# Patient Record
Sex: Female | Born: 1970 | Race: Black or African American | Hispanic: No | Marital: Single | Smoking: Never smoker
Health system: Southern US, Community
[De-identification: ages and names within clinical notes are randomized; demographics above are authoritative.]

## PROBLEM LIST (undated history)

## (undated) DIAGNOSIS — F410 Panic disorder [episodic paroxysmal anxiety] without agoraphobia: Secondary | ICD-10-CM

## (undated) DIAGNOSIS — F419 Anxiety disorder, unspecified: Secondary | ICD-10-CM

## (undated) DIAGNOSIS — E1142 Type 2 diabetes mellitus with diabetic polyneuropathy: Secondary | ICD-10-CM

## (undated) DIAGNOSIS — J45909 Unspecified asthma, uncomplicated: Secondary | ICD-10-CM

## (undated) DIAGNOSIS — E119 Type 2 diabetes mellitus without complications: Secondary | ICD-10-CM

## (undated) HISTORY — PX: TUBAL LIGATION: SHX77

## (undated) HISTORY — PX: CATARACT EXTRACTION, BILATERAL: SHX1313

## (undated) HISTORY — DX: Unspecified asthma, uncomplicated: J45.909

## (undated) HISTORY — DX: Type 2 diabetes mellitus with diabetic polyneuropathy: E11.42

## (undated) HISTORY — PX: KNEE SURGERY: SHX244

---

## 2002-05-27 ENCOUNTER — Other Ambulatory Visit: Admission: RE | Admit: 2002-05-27 | Discharge: 2002-05-27 | Payer: Self-pay | Admitting: Obstetrics and Gynecology

## 2011-04-11 ENCOUNTER — Emergency Department (HOSPITAL_COMMUNITY): Payer: 59

## 2011-04-11 ENCOUNTER — Emergency Department (HOSPITAL_COMMUNITY)
Admission: EM | Admit: 2011-04-11 | Discharge: 2011-04-11 | Disposition: A | Discharge status: Home / Self Care | Payer: 59 | Attending: Emergency Medicine | Admitting: Emergency Medicine

## 2011-04-11 DIAGNOSIS — R1013 Epigastric pain: Secondary | ICD-10-CM | POA: Insufficient documentation

## 2011-04-11 DIAGNOSIS — I10 Essential (primary) hypertension: Secondary | ICD-10-CM | POA: Insufficient documentation

## 2011-04-11 DIAGNOSIS — K219 Gastro-esophageal reflux disease without esophagitis: Secondary | ICD-10-CM | POA: Insufficient documentation

## 2011-04-11 DIAGNOSIS — L298 Other pruritus: Secondary | ICD-10-CM | POA: Insufficient documentation

## 2011-04-11 DIAGNOSIS — R079 Chest pain, unspecified: Secondary | ICD-10-CM | POA: Insufficient documentation

## 2011-04-11 DIAGNOSIS — R110 Nausea: Secondary | ICD-10-CM | POA: Insufficient documentation

## 2011-04-11 DIAGNOSIS — R21 Rash and other nonspecific skin eruption: Secondary | ICD-10-CM | POA: Insufficient documentation

## 2011-04-11 DIAGNOSIS — T7840XA Allergy, unspecified, initial encounter: Secondary | ICD-10-CM | POA: Insufficient documentation

## 2011-04-11 DIAGNOSIS — L2989 Other pruritus: Secondary | ICD-10-CM | POA: Insufficient documentation

## 2011-04-11 LAB — DIFFERENTIAL
Basophils Absolute: 0 10*3/uL (ref 0.0–0.1)
Basophils Relative: 0 % (ref 0–1)
Eosinophils Absolute: 0.2 10*3/uL (ref 0.0–0.7)
Eosinophils Relative: 2 % (ref 0–5)
Monocytes Absolute: 1 10*3/uL (ref 0.1–1.0)

## 2011-04-11 LAB — CBC
HCT: 31.4 % — ABNORMAL LOW (ref 36.0–46.0)
MCHC: 33.8 g/dL (ref 30.0–36.0)
Platelets: 231 10*3/uL (ref 150–400)
RDW: 12.3 % (ref 11.5–15.5)

## 2011-04-11 LAB — COMPREHENSIVE METABOLIC PANEL
ALT: 9 U/L (ref 0–35)
AST: 13 U/L (ref 0–37)
Alkaline Phosphatase: 48 U/L (ref 39–117)
CO2: 30 mEq/L (ref 19–32)
Calcium: 8.8 mg/dL (ref 8.4–10.5)
Creatinine, Ser: 1.05 mg/dL (ref 0.50–1.10)
Total Bilirubin: 0.7 mg/dL (ref 0.3–1.2)

## 2011-04-11 LAB — LIPASE, BLOOD: Lipase: 27 U/L (ref 11–59)

## 2011-04-11 LAB — D-DIMER, QUANTITATIVE: D-Dimer, Quant: 0.82 ug/mL-FEU — ABNORMAL HIGH (ref 0.00–0.48)

## 2011-04-11 MED ORDER — IOHEXOL 300 MG/ML  SOLN
85.0000 mL | Freq: Once | INTRAMUSCULAR | Status: AC | PRN
  Administered 2011-04-11: 85 mL via INTRAVENOUS

## 2014-12-09 ENCOUNTER — Emergency Department (HOSPITAL_COMMUNITY): Payer: No Typology Code available for payment source

## 2014-12-09 ENCOUNTER — Encounter (HOSPITAL_COMMUNITY): Payer: Self-pay | Admitting: Emergency Medicine

## 2014-12-09 ENCOUNTER — Emergency Department (HOSPITAL_COMMUNITY)
Admission: EM | Admit: 2014-12-09 | Discharge: 2014-12-09 | Disposition: A | Discharge status: Home / Self Care | Payer: No Typology Code available for payment source | Attending: Emergency Medicine | Admitting: Emergency Medicine

## 2014-12-09 DIAGNOSIS — Z79899 Other long term (current) drug therapy: Secondary | ICD-10-CM | POA: Diagnosis not present

## 2014-12-09 DIAGNOSIS — Z880 Allergy status to penicillin: Secondary | ICD-10-CM | POA: Insufficient documentation

## 2014-12-09 DIAGNOSIS — Y998 Other external cause status: Secondary | ICD-10-CM | POA: Insufficient documentation

## 2014-12-09 DIAGNOSIS — Z8659 Personal history of other mental and behavioral disorders: Secondary | ICD-10-CM | POA: Diagnosis not present

## 2014-12-09 DIAGNOSIS — S4992XA Unspecified injury of left shoulder and upper arm, initial encounter: Secondary | ICD-10-CM | POA: Diagnosis present

## 2014-12-09 DIAGNOSIS — S79922A Unspecified injury of left thigh, initial encounter: Secondary | ICD-10-CM | POA: Insufficient documentation

## 2014-12-09 DIAGNOSIS — M79652 Pain in left thigh: Secondary | ICD-10-CM

## 2014-12-09 DIAGNOSIS — Y92410 Unspecified street and highway as the place of occurrence of the external cause: Secondary | ICD-10-CM | POA: Diagnosis not present

## 2014-12-09 DIAGNOSIS — S79912A Unspecified injury of left hip, initial encounter: Secondary | ICD-10-CM | POA: Insufficient documentation

## 2014-12-09 DIAGNOSIS — M25512 Pain in left shoulder: Secondary | ICD-10-CM

## 2014-12-09 DIAGNOSIS — E119 Type 2 diabetes mellitus without complications: Secondary | ICD-10-CM | POA: Insufficient documentation

## 2014-12-09 DIAGNOSIS — Y9389 Activity, other specified: Secondary | ICD-10-CM | POA: Diagnosis not present

## 2014-12-09 DIAGNOSIS — M25552 Pain in left hip: Secondary | ICD-10-CM

## 2014-12-09 HISTORY — DX: Type 2 diabetes mellitus without complications: E11.9

## 2014-12-09 HISTORY — DX: Panic disorder (episodic paroxysmal anxiety): F41.0

## 2014-12-09 HISTORY — DX: Anxiety disorder, unspecified: F41.9

## 2014-12-09 LAB — BASIC METABOLIC PANEL
Anion gap: 8 (ref 5–15)
BUN: 12 mg/dL (ref 6–20)
CALCIUM: 9.3 mg/dL (ref 8.9–10.3)
CO2: 29 mmol/L (ref 22–32)
Chloride: 100 mmol/L — ABNORMAL LOW (ref 101–111)
Creatinine, Ser: 1.07 mg/dL — ABNORMAL HIGH (ref 0.44–1.00)
GFR calc non Af Amer: >60 mL/min (ref >60)
Glucose, Bld: 113 mg/dL — ABNORMAL HIGH (ref 65–99)
Potassium: 4.4 mmol/L (ref 3.5–5.1)
SODIUM: 137 mmol/L (ref 135–145)

## 2014-12-09 LAB — CBC WITH DIFFERENTIAL/PLATELET
BASOS ABS: 0 10*3/uL (ref 0.0–0.1)
Basophils Relative: 0 % (ref 0–1)
Eosinophils Absolute: 0.1 10*3/uL (ref 0.0–0.7)
Eosinophils Relative: 1 % (ref 0–5)
HEMATOCRIT: 33.9 % — AB (ref 36.0–46.0)
Hemoglobin: 11 g/dL — ABNORMAL LOW (ref 12.0–15.0)
LYMPHS ABS: 1.7 10*3/uL (ref 0.7–4.0)
LYMPHS PCT: 25 % (ref 12–46)
MCH: 30.6 pg (ref 26.0–34.0)
MCHC: 32.4 g/dL (ref 30.0–36.0)
MCV: 94.4 fL (ref 78.0–100.0)
MONO ABS: 0.8 10*3/uL (ref 0.1–1.0)
MONOS PCT: 11 % (ref 3–12)
NEUTROS ABS: 4.3 10*3/uL (ref 1.7–7.7)
Neutrophils Relative %: 63 % (ref 43–77)
PLATELETS: 320 10*3/uL (ref 150–400)
RBC: 3.59 MIL/uL — AB (ref 3.87–5.11)
RDW: 12.5 % (ref 11.5–15.5)
WBC: 6.7 10*3/uL (ref 4.0–10.5)

## 2014-12-09 LAB — I-STAT BETA HCG BLOOD, ED (MC, WL, AP ONLY)

## 2014-12-09 MED ORDER — NAPROXEN 250 MG PO TABS: 250.0000 mg | ORAL_TABLET | Freq: Two times a day (BID) | ORAL | Status: DC

## 2014-12-09 MED ORDER — MORPHINE SULFATE 4 MG/ML IJ SOLN
4.0000 mg | Freq: Once | INTRAMUSCULAR | Status: AC
  Administered 2014-12-09: 4 mg via INTRAVENOUS
  Filled 2014-12-09: qty 1

## 2014-12-09 MED ORDER — METHOCARBAMOL 500 MG PO TABS: 500.0000 mg | ORAL_TABLET | Freq: Two times a day (BID) | ORAL | Status: DC | PRN

## 2014-12-09 MED ORDER — ONDANSETRON HCL 4 MG/2ML IJ SOLN
4.0000 mg | Freq: Once | INTRAMUSCULAR | Status: AC
  Administered 2014-12-09: 4 mg via INTRAVENOUS
  Filled 2014-12-09: qty 2

## 2014-12-09 NOTE — ED Notes (Signed)
Pt remains in radiology 
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
## 2014-12-09 NOTE — ED Notes (Signed)
Patient transported to X-ray 
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
## 2015-06-15 ENCOUNTER — Other Ambulatory Visit: Payer: Self-pay | Admitting: Obstetrics and Gynecology

## 2015-06-19 ENCOUNTER — Other Ambulatory Visit: Payer: Self-pay | Admitting: Family Medicine

## 2015-06-19 DIAGNOSIS — N289 Disorder of kidney and ureter, unspecified: Secondary | ICD-10-CM

## 2015-06-20 NOTE — H&P (Signed)
[name], [name] NO.:  [id number]  MEDICAL RECORD NO.:  [id number]  LOCATION:  [facility]                         FACILITY:  [place]  PHYSICIAN:  Lovenia Kim, M.D.DATE OF BIRTH:  1971/03/06  DATE OF ADMISSION:  06/08/2015 DATE OF DISCHARGE:                             HISTORY & PHYSICAL   PREOPERATIVE DIAGNOSIS:  Menometrorrhagia, for definitive therapy.  HISTORY OF PRESENT ILLNESS:  She is a 45 year old African American female G3, P1 who presents with menometrorrhagia for definitive therapy.  MEDICATIONS:  Valtrex.  ALLERGIES:  Penicillin and Flagyl.  OB/GYN HISTORY:  History of 1 therapeutic abortion and 1 vaginal delivery.  SURGICAL HISTORY:  Remarkable for knee surgery and tubal ligation.  FAMILY HISTORY:  Diabetes.  PHYSICAL EXAMINATION:  GENERAL:  A well-developed, well-nourished [place] American female, in no acute distress. HEENT:  Normal. NECK:  Supple.  Full range of motion. LUNGS:  Clear. HEART:  Regular rate and rhythm. ABDOMEN:  Soft and nontender.  Pelvic exam reveals a bulky irregularly shaped uterus which is anteverted.  No adnexal masses. EXTREMITIES:  There are no cords. NEUROLOGICAL:  Nonfocal. SKIN:  Intact.  IMPRESSION:  Menometrorrhagia for definitive therapy.  PLAN:  Proceed with diagnostic hysteroscopy, D and C, MyoSure resection of fibroid and NovaSure endometrial ablation.  Risks of anesthesia, infection, bleeding, injury to surrounding organs, possible need for repair were discussed.  Delayed versus immediate complications to include bowel and bladder injury are noted.  The patient acknowledges and wishes to proceed.     Lovenia Kim, M.D.     RJT/MEDQ  D:  06/20/2015  T:  06/20/2015  Job:  [id number]

## 2015-06-21 ENCOUNTER — Encounter (HOSPITAL_COMMUNITY)
Admission: RE | Disposition: A | Discharge status: Home / Self Care | Payer: Self-pay | Source: Ambulatory Visit | Attending: Obstetrics and Gynecology

## 2015-06-21 ENCOUNTER — Encounter (HOSPITAL_COMMUNITY): Payer: Self-pay | Admitting: Anesthesiology

## 2015-06-21 ENCOUNTER — Ambulatory Visit (HOSPITAL_COMMUNITY): Payer: 59 | Admitting: Anesthesiology

## 2015-06-21 ENCOUNTER — Ambulatory Visit (HOSPITAL_COMMUNITY)
Admission: RE | Admit: 2015-06-21 | Discharge: 2015-06-21 | Disposition: A | Discharge status: Home / Self Care | Payer: 59 | Source: Ambulatory Visit | Attending: Obstetrics and Gynecology | Admitting: Obstetrics and Gynecology

## 2015-06-21 DIAGNOSIS — D250 Submucous leiomyoma of uterus: Secondary | ICD-10-CM | POA: Insufficient documentation

## 2015-06-21 DIAGNOSIS — N921 Excessive and frequent menstruation with irregular cycle: Secondary | ICD-10-CM | POA: Insufficient documentation

## 2015-06-21 DIAGNOSIS — Z880 Allergy status to penicillin: Secondary | ICD-10-CM | POA: Diagnosis not present

## 2015-06-21 HISTORY — PX: DILATATION & CURETTAGE/HYSTEROSCOPY WITH MYOSURE: SHX6511

## 2015-06-21 HISTORY — PX: NOVASURE ABLATION: SHX5394

## 2015-06-21 LAB — BASIC METABOLIC PANEL WITH GFR
Anion gap: 7 (ref 5–15)
BUN: 19 mg/dL (ref 6–20)
CO2: 28 mmol/L (ref 22–32)
Calcium: 9.4 mg/dL (ref 8.9–10.3)
Chloride: 102 mmol/L (ref 101–111)
Creatinine, Ser: 1.13 mg/dL — ABNORMAL HIGH (ref 0.44–1.00)
GFR calc Af Amer: >60 mL/min
GFR calc non Af Amer: 58 mL/min — ABNORMAL LOW
Glucose, Bld: 143 mg/dL — ABNORMAL HIGH (ref 65–99)
Potassium: 4.4 mmol/L (ref 3.5–5.1)
Sodium: 137 mmol/L (ref 135–145)

## 2015-06-21 LAB — CBC
HEMATOCRIT: 36.3 % (ref 36.0–46.0)
HEMOGLOBIN: 11.8 g/dL — AB (ref 12.0–15.0)
MCH: 30.7 pg (ref 26.0–34.0)
MCHC: 32.5 g/dL (ref 30.0–36.0)
MCV: 94.5 fL (ref 78.0–100.0)
Platelets: 317 10*3/uL (ref 150–400)
RBC: 3.84 MIL/uL — ABNORMAL LOW (ref 3.87–5.11)
RDW: 13.9 % (ref 11.5–15.5)
WBC: 6.2 10*3/uL (ref 4.0–10.5)

## 2015-06-21 LAB — GLUCOSE, CAPILLARY: Glucose-Capillary: 74 mg/dL (ref 65–99)

## 2015-06-21 LAB — HCG, SERUM, QUALITATIVE: Preg, Serum: NEGATIVE

## 2015-06-21 SURGERY — NOVASURE ABLATION
Anesthesia: General | Site: Vagina

## 2015-06-21 MED ORDER — ONDANSETRON HCL 4 MG/2ML IJ SOLN
INTRAMUSCULAR | Status: AC
  Filled 2015-06-21: qty 2

## 2015-06-21 MED ORDER — SCOPOLAMINE 1 MG/3DAYS TD PT72
1.0000 | MEDICATED_PATCH | Freq: Once | TRANSDERMAL | Status: DC
  Administered 2015-06-21: 1.5 mg via TRANSDERMAL

## 2015-06-21 MED ORDER — ONDANSETRON HCL 4 MG/2ML IJ SOLN
INTRAMUSCULAR | Status: DC | PRN
  Administered 2015-06-21: 4 mg via INTRAVENOUS

## 2015-06-21 MED ORDER — PROPOFOL 10 MG/ML IV BOLUS
INTRAVENOUS | Status: AC
  Filled 2015-06-21: qty 20

## 2015-06-21 MED ORDER — CEFAZOLIN SODIUM-DEXTROSE 2-3 GM-% IV SOLR
INTRAVENOUS | Status: AC
  Filled 2015-06-21: qty 50

## 2015-06-21 MED ORDER — FENTANYL CITRATE (PF) 250 MCG/5ML IJ SOLN
INTRAMUSCULAR | Status: AC
  Filled 2015-06-21: qty 5

## 2015-06-21 MED ORDER — KETOROLAC TROMETHAMINE 30 MG/ML IJ SOLN
INTRAMUSCULAR | Status: DC | PRN
  Administered 2015-06-21: 30 mg via INTRAVENOUS

## 2015-06-21 MED ORDER — SCOPOLAMINE 1 MG/3DAYS TD PT72
MEDICATED_PATCH | TRANSDERMAL | Status: AC
  Filled 2015-06-21: qty 1

## 2015-06-21 MED ORDER — BUPIVACAINE HCL (PF) 0.25 % IJ SOLN
INTRAMUSCULAR | Status: AC
  Filled 2015-06-21: qty 30

## 2015-06-21 MED ORDER — LACTATED RINGERS IV SOLN
INTRAVENOUS | Status: DC
  Administered 2015-06-21 (×2): via INTRAVENOUS

## 2015-06-21 MED ORDER — LIDOCAINE HCL (CARDIAC) 20 MG/ML IV SOLN
INTRAVENOUS | Status: AC
  Filled 2015-06-21: qty 5

## 2015-06-21 MED ORDER — MIDAZOLAM HCL 2 MG/2ML IJ SOLN
INTRAMUSCULAR | Status: AC
  Filled 2015-06-21: qty 2

## 2015-06-21 MED ORDER — DEXAMETHASONE SODIUM PHOSPHATE 10 MG/ML IJ SOLN
INTRAMUSCULAR | Status: AC
  Filled 2015-06-21: qty 1

## 2015-06-21 MED ORDER — MIDAZOLAM HCL 2 MG/2ML IJ SOLN
INTRAMUSCULAR | Status: DC | PRN
  Administered 2015-06-21: 2 mg via INTRAVENOUS

## 2015-06-21 MED ORDER — VASOPRESSIN 20 UNIT/ML IV SOLN
INTRAVENOUS | Status: DC | PRN
  Administered 2015-06-21: 20 [IU]

## 2015-06-21 MED ORDER — GLYCOPYRROLATE 0.2 MG/ML IJ SOLN
INTRAMUSCULAR | Status: DC | PRN
  Administered 2015-06-21: 0.1 mg via INTRAVENOUS

## 2015-06-21 MED ORDER — LIDOCAINE HCL (CARDIAC) 20 MG/ML IV SOLN
INTRAVENOUS | Status: DC | PRN
  Administered 2015-06-21: 100 mg via INTRAVENOUS

## 2015-06-21 MED ORDER — CEFAZOLIN SODIUM-DEXTROSE 2-3 GM-% IV SOLR
2.0000 g | INTRAVENOUS | Status: DC
  Administered 2015-06-21: 2 g via INTRAVENOUS

## 2015-06-21 MED ORDER — ACETAMINOPHEN 160 MG/5ML PO SOLN
950.0000 mg | Freq: Once | ORAL | Status: AC
  Administered 2015-06-21: 950 mg via ORAL

## 2015-06-21 MED ORDER — SODIUM CHLORIDE 0.9 % IJ SOLN
INTRAMUSCULAR | Status: AC
  Filled 2015-06-21: qty 50

## 2015-06-21 MED ORDER — LACTATED RINGERS IR SOLN
Status: DC | PRN
  Administered 2015-06-21: 3000 mL

## 2015-06-21 MED ORDER — PROPOFOL 10 MG/ML IV BOLUS
INTRAVENOUS | Status: DC | PRN
  Administered 2015-06-21: 250 mg via INTRAVENOUS

## 2015-06-21 MED ORDER — BUPIVACAINE HCL (PF) 0.25 % IJ SOLN
INTRAMUSCULAR | Status: DC | PRN
  Administered 2015-06-21: 20 mL

## 2015-06-21 MED ORDER — ACETAMINOPHEN 160 MG/5ML PO SOLN
ORAL | Status: AC
  Filled 2015-06-21: qty 40.6

## 2015-06-21 MED ORDER — ACETAMINOPHEN 325 MG PO TABS: 975.0000 mg | ORAL_TABLET | Freq: Once | ORAL | Status: DC

## 2015-06-21 MED ORDER — KETOROLAC TROMETHAMINE 30 MG/ML IJ SOLN
INTRAMUSCULAR | Status: AC
  Filled 2015-06-21: qty 1

## 2015-06-21 MED ORDER — VASOPRESSIN 20 UNIT/ML IV SOLN
INTRAVENOUS | Status: AC
  Filled 2015-06-21: qty 1

## 2015-06-21 MED ORDER — HYDROCODONE-IBUPROFEN 7.5-200 MG PO TABS: 1.0000 | ORAL_TABLET | Freq: Three times a day (TID) | ORAL | Status: DC | PRN

## 2015-06-21 MED ORDER — GLYCOPYRROLATE 0.2 MG/ML IJ SOLN
INTRAMUSCULAR | Status: AC
  Filled 2015-06-21: qty 1

## 2015-06-21 MED ORDER — FENTANYL CITRATE (PF) 100 MCG/2ML IJ SOLN
INTRAMUSCULAR | Status: DC | PRN
  Administered 2015-06-21 (×2): 50 ug via INTRAVENOUS
  Administered 2015-06-21: 25 ug via INTRAVENOUS

## 2015-06-21 MED ORDER — FENTANYL CITRATE (PF) 100 MCG/2ML IJ SOLN: 25.0000 ug | INTRAMUSCULAR | Status: DC | PRN

## 2015-06-21 SURGICAL SUPPLY — 17 items
ABLATOR ENDOMETRIAL BIPOLAR (ABLATOR) ×3 IMPLANT
CATH ROBINSON RED A/P 16FR (CATHETERS) ×3 IMPLANT
CLOTH BEACON ORANGE TIMEOUT ST (SAFETY) ×3 IMPLANT
CONTAINER PREFILL 10% NBF 60ML (FORM) ×3 IMPLANT
DEVICE MYOSURE REACH (MISCELLANEOUS) ×3 IMPLANT
GLOVE BIO SURGEON STRL SZ7.5 (GLOVE) ×3 IMPLANT
GLOVE BIOGEL PI IND STRL 7.0 (GLOVE) ×2 IMPLANT
GLOVE BIOGEL PI INDICATOR 7.0 (GLOVE) ×1
GOWN STRL REUS W/TWL LRG LVL3 (GOWN DISPOSABLE) ×6 IMPLANT
PACK VAGINAL MINOR WOMEN LF (CUSTOM PROCEDURE TRAY) ×3 IMPLANT
PAD OB MATERNITY 4.3X12.25 (PERSONAL CARE ITEMS) ×3 IMPLANT
PAD PREP 24X48 CUFFED NSTRL (MISCELLANEOUS) ×3 IMPLANT
SYR TB 1ML 25GX5/8 (SYRINGE) ×3 IMPLANT
TOWEL OR 17X24 6PK STRL BLUE (TOWEL DISPOSABLE) ×6 IMPLANT
TUBING AQUILEX INFLOW (TUBING) ×3 IMPLANT
TUBING AQUILEX OUTFLOW (TUBING) ×3 IMPLANT
WATER STERILE IRR 1000ML POUR (IV SOLUTION) ×3 IMPLANT

## 2015-06-21 NOTE — Transfer of Care (Signed)
Immediate Anesthesia Transfer of Care Note  Patient: [name]  Procedure(s) Performed: Procedure(s): NOVASURE ABLATION (N/A) DILATATION & CURETTAGE/HYSTEROSCOPY WITH MYOSURE (N/A)  Patient Location: PACU  Anesthesia Type:General  Level of Consciousness: awake, alert , oriented and patient cooperative  Airway & Oxygen Therapy: Patient Spontanous Breathing and Patient connected to nasal cannula oxygen  Post-op Assessment: Report given to RN and Post -op Vital signs reviewed and stable  Post vital signs: Reviewed and stable  Last Vitals:  Filed Vitals:   06/21/15 1301  BP: 144/80  Pulse: 84  Temp: 36.8 C  Resp: 20    Complications: No apparent anesthesia complications

## 2015-06-21 NOTE — Progress Notes (Signed)
Patient ID: [name], female   DOB: 04/06/1971, 45 y.o.   MRN: [id number] Patient seen and examined. Consent witnessed and signed. No changes noted. Update completed.

## 2015-06-21 NOTE — Anesthesia Postprocedure Evaluation (Signed)
Anesthesia Post Note  Patient: [name]  Procedure(s) Performed: Procedure(s) (LRB): NOVASURE ABLATION (N/A) DILATATION & CURETTAGE/HYSTEROSCOPY WITH MYOSURE (N/A)  Patient location during evaluation: PACU Anesthesia Type: General Level of consciousness: sedated Pain management: satisfactory to patient Vital Signs Assessment: post-procedure vital signs reviewed and stable Respiratory status: spontaneous breathing Cardiovascular status: stable Anesthetic complications: no    Last Vitals:  Filed Vitals:   06/21/15 1600 06/21/15 1615  BP:  137/88  Pulse: 58 61  Temp:    Resp:      Last Pain: There were no vitals filed for this visit.               Riccardo Dubin

## 2015-06-21 NOTE — Op Note (Signed)
06/21/2015  2:36 PM  PATIENT:  Clarisa Fling  45 y.o. female  PRE-OPERATIVE DIAGNOSIS:  Menorrhagia  POST-OPERATIVE DIAGNOSIS:  Menorrhagia  PROCEDURE:  Procedure(s): NOVASURE ABLATION DILATATION & CURETTAGE/HYSTEROSCOPY WITH MYOSURE  SURGEON:  Surgeon(s): Brien Few, MD  ASSISTANTS: none   ANESTHESIA:   local and general  ESTIMATED BLOOD LOSS: * No blood loss amount entered *   DRAINS: none   LOCAL MEDICATIONS USED:  MARCAINE    and Amount: 20 ml  SPECIMEN:  Source of Specimen:  fibroid, EMC  DISPOSITION OF SPECIMEN:  PATHOLOGY  COUNTS:  YES  DICTATION #[id number]  PLAN OF CARE: dc home  PATIENT DISPOSITION:  PACU - hemodynamically stable.

## 2015-06-21 NOTE — Anesthesia Preprocedure Evaluation (Signed)
Anesthesia Evaluation  Patient identified by MRN, date of birth, ID band Patient awake    Reviewed: Allergy & Precautions, H&P , Patient's Chart, lab work & pertinent test results, reviewed documented beta blocker date and time   Airway Mallampati: II  TM Distance: >3 FB Neck ROM: full    Dental no notable dental hx.    Pulmonary    Pulmonary exam normal breath sounds clear to auscultation       Cardiovascular  Rhythm:regular Rate:Normal     Neuro/Psych    GI/Hepatic   Endo/Other  diabetes  Renal/GU      Musculoskeletal   Abdominal   Peds  Hematology   Anesthesia Other Findings   Reproductive/Obstetrics                             Anesthesia Physical Anesthesia Plan  ASA: II  Anesthesia Plan:    Post-op Pain Management:    Induction: Intravenous  Airway Management Planned: LMA  Additional Equipment:   Intra-op Plan:   Post-operative Plan:   Informed Consent: I have reviewed the patients History and Physical, chart, labs and discussed the procedure including the risks, benefits and alternatives for the proposed anesthesia with the patient or authorized representative who has indicated his/her understanding and acceptance.   Dental Advisory Given and Dental advisory given  Plan Discussed with: CRNA and Surgeon  Anesthesia Plan Comments: (Discussed GA with LMA, possible sore throat, potential need to switch to ETT, N/V, pulmonary aspiration. Questions answered. )        Anesthesia Quick Evaluation

## 2015-06-21 NOTE — Op Note (Signed)
NAMEANARAH, DENBY NO.:  [id number]  MEDICAL RECORD NO.:  [id number]  LOCATION:  [facility]                          FACILITY:  [facility]  PHYSICIAN:  Lovenia Kim, M.D.DATE OF BIRTH:  1971/02/04  DATE OF PROCEDURE: DATE OF DISCHARGE:  06/21/2015                              OPERATIVE REPORT   PREOPERATIVE DIAGNOSIS:  Menometrorrhagia with submucous fibroid.  POSTOPERATIVE DIAGNOSIS:  Menometrorrhagia with submucous fibroid.  PROCEDURE:  Diagnostic hysteroscopy, D and C, MyoSure resection of submucous fibroid, NovaSure endometrial ablation.  SURGEON:  Lovenia Kim, MD  ASSISTANT:  None.  ANESTHESIA:  General and local.  ESTIMATED BLOOD LOSS:  Less than 50 mL.  FLUID DEFICIT:  Less than 50 mL.  COMPLICATIONS:  None.  DRAINS:  None.  COUNTS:  Correct.  DISPOSITION:  The patient to recovery in good condition.  BRIEF OPERATIVE NOTE:  After being apprised of risks of anesthesia, infection, bleeding, injury to surrounding organs, possible need for repair, delayed versus immediate complications to include bowel and bladder injury, possible need for repair, the patient was brought to the operating room where she was administered general anesthetic without complications.  Prepped and draped in usual sterile fashion. Catheterized until the bladder was empty.  Exam under anesthesia reveals a bulky anteflexed uterus.  No adnexal masses.  Dilute Pitressin solution placed at 3 and 9 o'clock 20 mL total dilute Marcaine solution standard paracervical block 20 mL total.  Cervix dilated up to a 17 Pratt dilator.  Hysteroscope placed.  Visualization reveals a submucous fibroid in the lower left portion of the uterine mid body and also in the anterior fundal.  The MyoSure was entered and resection and multiple passes of the posterior wall anterior fall fibroid were done without difficulty.  Good hemostasis was noted.  The instrument was removed. NovaSure device  was placed and seated to a width of 3.2, a length of 6, and initiated with a negative CO2 test.  At the termination of the procedure, NovaSure device was removed and inspected.  The cavity was found to be intact without evidence of perforation.  Good evidence of ablation noted.  The patient tolerated this procedure well, was awakened, and transferred to recovery in good condition.     Lovenia Kim, M.D.     RJT/MEDQ  D:  06/21/2015  T:  06/21/2015  Job:  [id number]

## 2015-06-21 NOTE — Discharge Instructions (Signed)

## 2015-06-21 NOTE — Anesthesia Procedure Notes (Signed)
Procedure Name: LMA Insertion Date/Time: 06/21/2015 2:00 PM Performed by: Georgeanne Nim Pre-anesthesia Checklist: Patient identified, Patient being monitored, Timeout performed, Emergency Drugs available and Suction available Patient Re-evaluated:Patient Re-evaluated prior to inductionOxygen Delivery Method: Circle system utilized Preoxygenation: Pre-oxygenation with 100% oxygen Intubation Type: IV induction Ventilation: Mask ventilation without difficulty LMA: LMA with gastric port inserted LMA Size: 4.0 Number of attempts: 1 Placement Confirmation: positive ETCO2,  CO2 detector and breath sounds checked- equal and bilateral Tube secured with: Tape Dental Injury: Teeth and Oropharynx as per pre-operative assessment

## 2015-06-22 ENCOUNTER — Other Ambulatory Visit: Payer: Self-pay | Admitting: Family Medicine

## 2015-06-22 ENCOUNTER — Encounter (HOSPITAL_COMMUNITY): Payer: Self-pay | Admitting: Obstetrics and Gynecology

## 2015-06-22 ENCOUNTER — Other Ambulatory Visit: Payer: Self-pay

## 2015-06-22 ENCOUNTER — Ambulatory Visit
Admission: RE | Admit: 2015-06-22 | Discharge: 2015-06-22 | Disposition: A | Discharge status: Home / Self Care | Payer: 59 | Source: Ambulatory Visit | Attending: Family Medicine | Admitting: Family Medicine

## 2015-06-22 DIAGNOSIS — N289 Disorder of kidney and ureter, unspecified: Secondary | ICD-10-CM

## 2015-08-20 ENCOUNTER — Encounter: Payer: Self-pay | Admitting: Family Medicine

## 2015-08-20 ENCOUNTER — Ambulatory Visit (INDEPENDENT_AMBULATORY_CARE_PROVIDER_SITE_OTHER): Payer: 59 | Admitting: Family Medicine

## 2015-08-20 VITALS — Ht 74.0 in | Wt 245.5 lb

## 2015-08-20 DIAGNOSIS — R739 Hyperglycemia, unspecified: Secondary | ICD-10-CM | POA: Diagnosis not present

## 2015-08-20 DIAGNOSIS — E669 Obesity, unspecified: Secondary | ICD-10-CM | POA: Diagnosis not present

## 2015-08-20 NOTE — Patient Instructions (Signed)
   Controlling your blood sugar and weight is going to mean making some significant changes in both your diet and your activity level.   The good news about making dietary changes:  TASTE PREFERENCES ARE LEARNED.  This means that it will get easier to choose foods you know are good for you if you are exposed to them enough.    Diet Recommendations for Diabetes  Starchy (carb) foods: Bread, rice, pasta, potatoes, corn, cereal, grits, crackers, bagels, muffins, all baked goods.  (Fruits, milk, and yogurt also have carbohydrate, but most of these foods will not spike your blood sugar as the starchy foods will.)  A few fruits do cause high blood sugars; use small portions of bananas (limit to 1/2 at a time), grapes, watermelon, oranges, and most tropical fruits.    Protein foods: Meat, fish, poultry, eggs, dairy foods, and beans such as pinto and kidney beans (beans also provide carbohydrate).   1. Eat at least 3 meals and 1-2 snacks per day. Never go more than 4-5 hours while awake without eating. Eat breakfast within the first hour of getting up.   2. Limit starchy foods to TWO per meal and ONE per snack. ONE portion of a starchy  food is equal to the following:   - ONE slice of bread (or its equivalent, such as half of a hamburger bun).   - 1/2 cup of a "scoopable" starchy food such as potatoes or rice.   - 15 grams of total carbohydrate as shown on food label.   - Beverages count too!  For example, for every 15 grams of carb from soda/juice, this is ONE carb portion.   3. Include at every meal: a protein food, a carb food, and vegetables and/or fruit.   - Obtain twice the volume of veg's as protein or carbohydrate foods for both lunch and dinner.   - Fresh or frozen veg's are best.   - Keep frozen veg's on hand for a quick vegetable serving.     - Consider using canned soup occasionally; ALWAYS first microwave veg's, then add soup, and re-heat.    4. Physical activity:  30 min 5 X wk.  For best  blood sugar control, you may want to do this after your biggest meal.    - Also important is to move intermittently during the day, i.e., "microbreaks" of moving 5 min for every 60 you sit or 2 min for every 20 you sit.

## 2015-08-20 NOTE — Progress Notes (Signed)
Medical Nutrition Therapy:  Appt start time: 0900 end time:  1000. [name] was referred by Jennifer Few, MD [facility]) for elevated A1C and obesity.  She would like to discontinue metformin, so is interested in making dietary/lifestyle changes.  She has started an herbal tea "cleanse," for one week, with a plan to do a "diabetic tea" following this.  Her grandmother has followed this regimen for more than a year, and has controlled her DM well.  [name] acknowledges that healthy food choices and exercise will be a necessary part of blood sugar mgmt.    [name] travels as a Ecologist, usually on the road Mon-Fri, so is usually [name].  [name] said she will sometimes bring breakfast to work with her, then end up throwing it away when she gets busy, and has forgotten to eat it.    Assessment:  Primary concerns today: Weight management and Blood sugar control.   Learning Readiness:  Ready  Usual eating pattern includes 1-3 meals and 1-2 snacks per day. Frequent foods and beverages include chx, Sprite, ff's, fruit ~5 X wk.  Eats veg's 2-3 X wk (likes grn beans, broccoli, carrots).  Avoided foods include most veg's.   Usual physical activity is variable since knee surgery in June 2016 (chipped patella from fall).  [place] again in Dec 2016, and injured back, but currently walks ~60 min 1-2 X a week.  She started making better food choices ~August 2016, but said her weight has continued to rise despite her efforts.  [name] said her normal weight is ~202.  A trial of phentermine was helpful in appetite control, but discontinued after >1 yr b/c of side effects.    24-hr recall: (Up at 7:30 AM) B ( AM)-   water Snk (1 PM)-   1 small bag chips, 8 oz ginger ale L ( PM)-  Slept much of day; didn't feel well, & had  Snk ( PM)-  been traveling till late Sat PM D (10 PM)-  1 1/2 cans chx noodle soup (headache), 8 oz ginger ale Snk ( PM)-  --- Typical day? Yes.   There is no "normal";  schedule is erratic.    Progress Towards Goal(s):  In progress.   Nutritional Diagnosis:  NB-1.7 Undesireable food choices As related to low priority on food choices.  As evidenced by erratic eating pattern and frequent takeout/restaurant foods.    Intervention:  Nutrition education.  Handouts given during visit include:  AVS  Plate method handout  Demonstrated degree of understanding via:  Teach Back  Barriers to learning/adherence to lifestyle change: Weekly travel for work.    Monitoring/Evaluation:  Dietary intake, exercise, and body weight in 6 week(s).

## 2015-10-01 ENCOUNTER — Ambulatory Visit: Payer: 59 | Admitting: Family Medicine

## 2019-03-09 ENCOUNTER — Emergency Department (HOSPITAL_COMMUNITY)
Admission: EM | Admit: 2019-03-09 | Discharge: 2019-03-10 | Disposition: A | Discharge status: Home / Self Care | Payer: BC Managed Care – PPO | Attending: Emergency Medicine | Admitting: Emergency Medicine

## 2019-03-09 ENCOUNTER — Emergency Department (HOSPITAL_COMMUNITY): Payer: BC Managed Care – PPO

## 2019-03-09 ENCOUNTER — Other Ambulatory Visit: Payer: Self-pay

## 2019-03-09 ENCOUNTER — Encounter (HOSPITAL_COMMUNITY): Payer: Self-pay

## 2019-03-09 DIAGNOSIS — R51 Headache: Secondary | ICD-10-CM | POA: Insufficient documentation

## 2019-03-09 DIAGNOSIS — R479 Unspecified speech disturbances: Secondary | ICD-10-CM | POA: Diagnosis not present

## 2019-03-09 DIAGNOSIS — Z7984 Long term (current) use of oral hypoglycemic drugs: Secondary | ICD-10-CM | POA: Insufficient documentation

## 2019-03-09 DIAGNOSIS — E1165 Type 2 diabetes mellitus with hyperglycemia: Secondary | ICD-10-CM | POA: Insufficient documentation

## 2019-03-09 DIAGNOSIS — R200 Anesthesia of skin: Secondary | ICD-10-CM | POA: Insufficient documentation

## 2019-03-09 DIAGNOSIS — R739 Hyperglycemia, unspecified: Secondary | ICD-10-CM

## 2019-03-09 DIAGNOSIS — R202 Paresthesia of skin: Secondary | ICD-10-CM | POA: Diagnosis not present

## 2019-03-09 LAB — CBC WITH DIFFERENTIAL/PLATELET
Abs Immature Granulocytes: 0.01 10*3/uL (ref 0.00–0.07)
Basophils Absolute: 0 10*3/uL (ref 0.0–0.1)
Basophils Relative: 1 %
Eosinophils Absolute: 0 10*3/uL (ref 0.0–0.5)
Eosinophils Relative: 0 %
HCT: 37.6 % (ref 36.0–46.0)
Hemoglobin: 12.3 g/dL (ref 12.0–15.0)
Immature Granulocytes: 0 %
Lymphocytes Relative: 27 %
Lymphs Abs: 1.6 10*3/uL (ref 0.7–4.0)
MCH: 32.2 pg (ref 26.0–34.0)
MCHC: 32.7 g/dL (ref 30.0–36.0)
MCV: 98.4 fL (ref 80.0–100.0)
Monocytes Absolute: 0.7 10*3/uL (ref 0.1–1.0)
Monocytes Relative: 12 %
Neutro Abs: 3.5 10*3/uL (ref 1.7–7.7)
Neutrophils Relative %: 60 %
Platelets: 245 10*3/uL (ref 150–400)
RBC: 3.82 MIL/uL — ABNORMAL LOW (ref 3.87–5.11)
RDW: 11.9 % (ref 11.5–15.5)
WBC: 5.8 10*3/uL (ref 4.0–10.5)
nRBC: 0 % (ref 0.0–0.2)

## 2019-03-09 LAB — COMPREHENSIVE METABOLIC PANEL
ALT: 12 U/L (ref 0–44)
AST: 18 U/L (ref 15–41)
Albumin: 2.8 g/dL — ABNORMAL LOW (ref 3.5–5.0)
Alkaline Phosphatase: 57 U/L (ref 38–126)
Anion gap: 9 (ref 5–15)
BUN: 9 mg/dL (ref 6–20)
CO2: 17 mmol/L — ABNORMAL LOW (ref 22–32)
Calcium: 6.9 mg/dL — ABNORMAL LOW (ref 8.9–10.3)
Chloride: 111 mmol/L (ref 98–111)
Creatinine, Ser: 0.72 mg/dL (ref 0.44–1.00)
GFR calc Af Amer: >60 mL/min (ref >60)
GFR calc non Af Amer: >60 mL/min (ref >60)
Glucose, Bld: 399 mg/dL — ABNORMAL HIGH (ref 70–99)
Potassium: 3.3 mmol/L — ABNORMAL LOW (ref 3.5–5.1)
Sodium: 137 mmol/L (ref 135–145)
Total Bilirubin: 0.9 mg/dL (ref 0.3–1.2)
Total Protein: 5.3 g/dL — ABNORMAL LOW (ref 6.5–8.1)

## 2019-03-09 LAB — BLOOD GAS, VENOUS
Acid-base deficit: 2.4 mmol/L — ABNORMAL HIGH (ref 0.0–2.0)
Bicarbonate: 20.3 mmol/L (ref 20.0–28.0)
O2 Saturation: 99.7 %
Patient temperature: 98.6
pCO2, Ven: 30.2 mmHg — ABNORMAL LOW (ref 44.0–60.0)
pH, Ven: 7.442 — ABNORMAL HIGH (ref 7.250–7.430)
pO2, Ven: 204 mmHg — ABNORMAL HIGH (ref 32.0–45.0)

## 2019-03-09 LAB — URINALYSIS, ROUTINE W REFLEX MICROSCOPIC
Bacteria, UA: NONE SEEN
Bilirubin Urine: NEGATIVE
Glucose, UA: >=500 mg/dL — AB
Hgb urine dipstick: NEGATIVE
Ketones, ur: 20 mg/dL — AB
Leukocytes,Ua: NEGATIVE
Nitrite: NEGATIVE
Protein, ur: NEGATIVE mg/dL
Specific Gravity, Urine: 1.035 — ABNORMAL HIGH (ref 1.005–1.030)
pH: 6 (ref 5.0–8.0)

## 2019-03-09 LAB — RAPID URINE DRUG SCREEN, HOSP PERFORMED
Amphetamines: NOT DETECTED
Barbiturates: NOT DETECTED
Benzodiazepines: NOT DETECTED
Cocaine: NOT DETECTED
Opiates: NOT DETECTED
Tetrahydrocannabinol: NOT DETECTED

## 2019-03-09 LAB — CBG MONITORING, ED
Glucose-Capillary: 341 mg/dL — ABNORMAL HIGH (ref 70–99)
Glucose-Capillary: 354 mg/dL — ABNORMAL HIGH (ref 70–99)
Glucose-Capillary: 463 mg/dL — ABNORMAL HIGH (ref 70–99)

## 2019-03-09 LAB — HCG, QUANTITATIVE, PREGNANCY: hCG, Beta Chain, Quant, S: <1 m[IU]/mL (ref <5)

## 2019-03-09 MED ORDER — METFORMIN HCL 500 MG PO TABS: 500.0000 mg | ORAL_TABLET | Freq: Two times a day (BID) | ORAL | 0 refills | Status: DC

## 2019-03-09 MED ORDER — GADOBUTROL 1 MMOL/ML IV SOLN
10.0000 mL | Freq: Once | INTRAVENOUS | Status: AC | PRN
  Administered 2019-03-09: 10 mL via INTRAVENOUS

## 2019-03-09 MED ORDER — INSULIN ASPART 100 UNIT/ML ~~LOC~~ SOLN
5.0000 [IU] | Freq: Once | SUBCUTANEOUS | Status: AC
  Administered 2019-03-09: 5 [IU] via SUBCUTANEOUS
  Filled 2019-03-09: qty 0.05

## 2019-03-09 MED ORDER — SODIUM CHLORIDE 0.9 % IV BOLUS
1000.0000 mL | Freq: Once | INTRAVENOUS | Status: AC
  Administered 2019-03-09: 1000 mL via INTRAVENOUS

## 2019-03-09 NOTE — Discharge Instructions (Signed)
Please call your primary doctor tomorrow to schedule close follow-up appointment regarding symptoms you are having today and the elevated blood sugars.  Recommend starting the newly prescribed metformin.  If you develop numbness, weakness, speech changes, vision changes, chest pain or difficulty breathing or fevers, please return to ER for reassessment.

## 2019-03-09 NOTE — ED Triage Notes (Signed)
BIB EMS. EMS reports call was intially headache, and  tingling in hands. CBG with EMS 512 no history of diabetes however hx of pre diabetes 5 years ago. PT reports feeling better after receiving IV fluids 400 NS.  Pt alert and oriented at this time.   20 RAC 400 of NS in route  118/78 80

## 2019-03-09 NOTE — ED Notes (Addendum)
Pt. Documented in error MR BRAIN WO CONTRAST.

## 2019-03-09 NOTE — ED Provider Notes (Addendum)
[facility] DEPT Provider Note   CSN: [id number] Arrival date & time: 03/09/19  1944     History   Chief Complaint Chief Complaint  Patient presents with   Hyperglycemia    HPI [name] is a 48 y.o. female.  Presents the ER with transient episode of difficulty speaking, headache, tingling and elevated sugar.  Patient states around 630 this evening she had sudden onset head pressure, left hand tingling, difficulty speaking.  States she had difficulty getting the right words out and was somewhat confused.  Denies any loss of consciousness, no weakness, no vision changes.  Noted some numbness/tingling sensation in her left upper arm, left hand.  Symptoms lasted around 30 minutes then spontaneously resolved. She has no ongoing symptoms at this time. States she had gestational diabetes and was told she was a prediabetic but has not been formally diagnosed.     HPI  Past Medical History:  Diagnosis Date   Anxiety    Diabetes mellitus without complication ([place])    Panic attack     There are no active problems to display for this patient.   Past Surgical History:  Procedure Laterality Date   DILATATION & CURETTAGE/HYSTEROSCOPY WITH MYOSURE N/A 06/21/2015   Procedure: DILATATION & CURETTAGE/HYSTEROSCOPY WITH MYOSURE;  Surgeon: Brien Few, MD;  Location: [facility];  Service: Gynecology;  Laterality: N/A;   KNEE SURGERY Right    NOVASURE ABLATION N/A 06/21/2015   Procedure: NOVASURE ABLATION;  Surgeon: Brien Few, MD;  Location: [facility];  Service: Gynecology;  Laterality: N/A;     OB History   No obstetric history on file.      Home Medications    Prior to Admission medications   Medication Sig Start Date End Date Taking? Authorizing Provider  albuterol (PROAIR HFA) 108 (90 Base) MCG/ACT inhaler Take 2 puffs by mouth every 4 (four) hours as needed for wheezing or shortness of breath.    Yes [provider]  metFORMIN  (GLUCOPHAGE) 500 MG tablet Take 1 tablet (500 mg total) by mouth 2 (two) times daily with a meal. 03/09/19   Ada Woodbury, Ellwood Dense, MD    Family History No family history on file.  Social History Social History   Tobacco Use   Smoking status: Never Smoker  Substance Use Topics   Alcohol use: Yes   Drug use: No     Allergies   Penicillins   Review of Systems Review of Systems  Constitutional: Negative for chills and fever.  HENT: Negative for ear pain and sore throat.   Eyes: Negative for pain and visual disturbance.  Respiratory: Negative for cough and shortness of breath.   Cardiovascular: Negative for chest pain and palpitations.  Gastrointestinal: Negative for abdominal pain and vomiting.  Genitourinary: Negative for dysuria and hematuria.  Musculoskeletal: Negative for arthralgias and back pain.  Skin: Negative for color change and rash.  Neurological: Positive for speech difficulty and numbness. Negative for seizures and syncope.  All other systems reviewed and are negative.    Physical Exam Updated Vital Signs BP 120/74    Pulse 78    Temp 98.2 F (36.8 C) (Oral)    Resp 14    SpO2 100%   Physical Exam Vitals signs and nursing note reviewed.  Constitutional:      General: She is not in acute distress.    Appearance: She is well-developed.  HENT:     Head: Normocephalic and atraumatic.  Eyes:     Conjunctiva/sclera:  Conjunctivae normal.  Neck:     Musculoskeletal: Neck supple.  Cardiovascular:     Rate and Rhythm: Normal rate and regular rhythm.     Heart sounds: No murmur.  Pulmonary:     Effort: Pulmonary effort is normal. No respiratory distress.     Breath sounds: Normal breath sounds.  Abdominal:     Palpations: Abdomen is soft.     Tenderness: There is no abdominal tenderness.  Skin:    General: Skin is warm and dry.  Neurological:     General: No focal deficit present.     Mental Status: She is alert and oriented to person, place, and time.  Mental status is at baseline.     Cranial Nerves: No cranial nerve deficit.     Sensory: No sensory deficit.     Motor: No weakness.     Coordination: Coordination normal.  Psychiatric:        Mood and Affect: Mood normal.        Behavior: Behavior normal.      ED Treatments / Results  Labs (all labs ordered are listed, but only abnormal results are displayed) Labs Reviewed  CBC WITH DIFFERENTIAL/PLATELET - Abnormal; Notable for the following components:      Result Value   RBC 3.82 (*)    All other components within normal limits  COMPREHENSIVE METABOLIC PANEL - Abnormal; Notable for the following components:   Potassium 3.3 (*)    CO2 17 (*)    Glucose, Bld 399 (*)    Calcium 6.9 (*)    Total Protein 5.3 (*)    Albumin 2.8 (*)    All other components within normal limits  URINALYSIS, ROUTINE W REFLEX MICROSCOPIC - Abnormal; Notable for the following components:   Color, Urine STRAW (*)    Specific Gravity, Urine 1.035 (*)    Glucose, UA >=500 (*)    Ketones, ur 20 (*)    All other components within normal limits  BLOOD GAS, VENOUS - Abnormal; Notable for the following components:   pH, Ven 7.442 (*)    pCO2, Ven 30.2 (*)    pO2, Ven 204.0 (*)    Acid-base deficit 2.4 (*)    All other components within normal limits  CBG MONITORING, ED - Abnormal; Notable for the following components:   Glucose-Capillary 463 (*)    All other components within normal limits  CBG MONITORING, ED - Abnormal; Notable for the following components:   Glucose-Capillary 354 (*)    All other components within normal limits  RAPID URINE DRUG SCREEN, HOSP PERFORMED  HCG, QUANTITATIVE, PREGNANCY  HEMOGLOBIN A1C    EKG EKG Interpretation  Date/Time:  Wednesday March 09 2019 20:00:18 EDT Ventricular Rate:  73 PR Interval:    QRS Duration: 87 QT Interval:  369 QTC Calculation: 407 R Axis:   62 Text Interpretation:  Sinus rhythm No acute STEMI Confirmed by Madalyn Rob [phone]) on  03/09/2019 8:02:49 PM   Radiology Ct Head Wo Contrast  Result Date: 03/09/2019 CLINICAL DATA:  48 year old female with sudden onset aphasia and pressure headache and left arm numbness. EXAM: CT HEAD WITHOUT CONTRAST TECHNIQUE: Contiguous axial images were obtained from the base of the skull through the vertex without intravenous contrast. COMPARISON:  None. FINDINGS: Brain: The ventricles and sulci appropriate size for patient's age. The gray-white matter discrimination is preserved. There is no acute intracranial hemorrhage. No mass effect or midline shift. No extra-axial fluid collection. Vascular: No hyperdense vessel or unexpected calcification.  Skull: Normal. Negative for fracture or focal lesion. Sinuses/Orbits: Partial opacification of the left mastoid air cells. The remainder of the visualized paranasal sinuses and mastoid air cells are clear. Other: None IMPRESSION: No acute intracranial pathology. Electronically Signed   By: Anner Crete M.D.   On: 03/09/2019 20:45   Mr Angio Neck W Wo Contrast  Result Date: 03/09/2019 CLINICAL DATA:  Initial evaluation for transient aphasia, left arm numbness. EXAM: MRA NECK WITHOUT AND WITH CONTRAST TECHNIQUE: Multiplanar and multiecho pulse sequences of the neck were obtained without and with intravenous contrast. Angiographic images of the neck were obtained using MRA technique without and with intravenous contrast. CONTRAST:  33mL GADAVIST GADOBUTROL 1 MMOL/ML IV SOLN COMPARISON:  Prior brain MRI from earlier the same day. FINDINGS: Source images reviewed. AORTIC ARCH: Visualized aortic arch of normal caliber with normal 3 vessel morphology. No hemodynamically significant stenosis seen about the origin of the great vessels. Visualized subclavian arteries widely patent. RIGHT CAROTID SYSTEM: Right common and internal carotid arteries widely patent without stenosis or vascular occlusion. No significant atheromatous narrowing seen about the right carotid  bifurcation. LEFT CAROTID SYSTEM: Left common and internal carotid arteries widely patent without stenosis or vascular occlusion. No significant atheromatous narrowing seen about the left carotid bifurcation. VERTEBRAL ARTERIES: Both vertebral arteries arise from the subclavian arteries. Vertebral arteries widely patent within the neck with no evidence for stenosis or occlusion. IMPRESSION: Normal MRA of the neck with no vascular occlusion, hemodynamically significant stenosis, or other acute vascular abnormality. Electronically Signed   By: Jeannine Boga M.D.   On: 03/09/2019 22:43   Mr Brain Wo Contrast  Result Date: 03/09/2019 CLINICAL DATA:  Headache with tingling in the hands. EXAM: MRI HEAD WITHOUT CONTRAST TECHNIQUE: Multiplanar, multiecho pulse sequences of the brain and surrounding structures were obtained without intravenous contrast. COMPARISON:  Head CT same day FINDINGS: Brain: The brain has a normal appearance without evidence of malformation, atrophy, old or acute small or large vessel infarction, mass lesion, hemorrhage, hydrocephalus or extra-axial collection. Vascular: Major vessels at the base of the brain show flow. Venous sinuses appear patent. Skull and upper cervical spine: Normal. Sinuses/Orbits: Clear/normal. Other: None significant. IMPRESSION: Normal examination. No abnormality seen to explain the presenting symptoms. Electronically Signed   By: Nelson Chimes M.D.   On: 03/09/2019 21:58    Procedures Procedures (including critical care time)  Medications Ordered in ED Medications  sodium chloride 0.9 % bolus 1,000 mL (1,000 mLs Intravenous New Bag/Given (Non-Interop) 03/09/19 2245)  insulin aspart (novoLOG) injection 5 Units (5 Units Subcutaneous Given 03/09/19 2246)  gadobutrol (GADAVIST) 1 MMOL/ML injection 10 mL (10 mLs Intravenous Contrast Given 03/09/19 2215)     Initial Impression / Assessment and Plan / ED Course  I have reviewed the triage vital signs and the  nursing notes.  Pertinent labs & imaging results that were available during my care of the patient were reviewed by me and considered in my medical decision making (see chart for details).  Clinical Course as of Mar 08 2256  Wed Mar 09, 2019  2051 Rory Percy with [facility] neurology reviewed case, recommends MRI, if negative, do not need admit for full TIA workup   [RD]  2243 Recheck patient, still asymptomatic, awaiting final MRI results then will likely discharge home   [RD]    Clinical Course User Index [RD] Lucrezia Starch, MD       48 year old lady who presented to the ER after transient episode of difficulty speaking, left  arm tingling.  Here patient asymptomatic, noted to have hyperglycemia.  Prior diagnosis of diabetes, but high suspicion for this.  No DKA.  Discussed case with neurology due to concern for possible TIA, recommend obtaining MRI, if negative given concomitant hyperglycemia does not need full in patient admission for TIA work up. Patient remained asymptomatic. Given fluids, small insulin and monitored in ER.  Will start on Rx for metformin. Recommended close recheck with PCP to follow up on A1C.  Final Clinical Impressions(s) / ED Diagnoses   Final diagnoses:  Hyperglycemia  Transient speech disturbance  Numbness and tingling in left arm    ED Discharge Orders         Ordered    metFORMIN (GLUCOPHAGE) 500 MG tablet  2 times daily with meals     03/09/19 2245           Lucrezia Starch, MD 03/09/19 2257    Lucrezia Starch, MD 03/09/19 2258

## 2019-03-09 NOTE — ED Notes (Signed)
Patient transported to MRI 

## 2019-03-10 DIAGNOSIS — E119 Type 2 diabetes mellitus without complications: Secondary | ICD-10-CM | POA: Insufficient documentation

## 2019-03-10 DIAGNOSIS — Z20822 Contact with and (suspected) exposure to covid-19: Secondary | ICD-10-CM | POA: Insufficient documentation

## 2019-03-10 NOTE — ED Notes (Signed)
Pt was verbalized discharge instructions. Pt had no further questions at this time. NAD. 

## 2019-03-11 LAB — HEMOGLOBIN A1C
Hgb A1c MFr Bld: 14.1 % — ABNORMAL HIGH (ref 4.8–5.6)
Mean Plasma Glucose: 358 mg/dL

## 2019-04-18 ENCOUNTER — Encounter: Payer: BC Managed Care – PPO | Attending: *Deleted | Admitting: Skilled Nursing Facility1

## 2019-04-18 ENCOUNTER — Encounter: Payer: Self-pay | Admitting: Skilled Nursing Facility1

## 2019-04-18 ENCOUNTER — Other Ambulatory Visit: Payer: Self-pay

## 2019-04-18 DIAGNOSIS — E119 Type 2 diabetes mellitus without complications: Secondary | ICD-10-CM | POA: Diagnosis not present

## 2019-04-18 NOTE — Progress Notes (Signed)
Diabetes Self-Management Education  Visit Type: First/Initial  04/18/2019  [name], identified by name and date of birth, is a 48 y.o. female with a diagnosis of Diabetes: Type 2.   ASSESSMENT  Height _0  (1.88 m), weight 221 lb 11.2 oz (100.6 kg). Body mass index is 28.46 kg/m.   Pt does have the free style libre: checking about 4 times a day-197, 126, 187, 192, 148, 155, 217 Pt states she does have a hx of GDM and has DM type 2 since September.   Pt states she hates water but is trying really hard to drink it.  Pt states she does skips meals a lot.  Pt has made a lot of positive changes: eating more often and drinking less caloric drinks.   Goals: Check your blood sugar fasting (before you eat anything) and 2 hours post prandial (2 hours after eating)  Check your feet every day Go to the eye doctor every year for a dilated exam Visit the dentist 1-2 times a year [place] job getting your water in!   Diabetes Self-Management Education - 04/18/19 1627      Visit Information   Visit Type  First/Initial      Initial Visit   Diabetes Type  Type 2    Are you currently following a meal plan?  No    Are you taking your medications as prescribed?  Yes      Health Coping   How would you rate your overall health?  Good      Psychosocial Assessment   Patient Belief/Attitude about Diabetes  Motivated to manage diabetes      Pre-Education Assessment   Patient understands the diabetes disease and treatment process.  Needs Instruction    Patient understands incorporating nutritional management into lifestyle.  Needs Instruction    Patient undertands incorporating physical activity into lifestyle.  Needs Instruction    Patient understands using medications safely.  Needs Instruction    Patient understands monitoring blood glucose, interpreting and using results  Needs Instruction    Patient understands prevention, detection, and treatment of acute complications.  Needs  Instruction    Patient understands prevention, detection, and treatment of chronic complications.  Needs Instruction    Patient understands how to develop strategies to address psychosocial issues.  Needs Instruction    Patient understands how to develop strategies to promote health/change behavior.  Needs Instruction      Complications   Last HgB A1C per patient/outside source  14.1 %    How often do you check your blood sugar?  > 4 times/day    Number of hypoglycemic episodes per month  0    Number of hyperglycemic episodes per week  0    Have you had a dilated eye exam in the past 12 months?  No    Have you had a dental exam in the past 12 months?  No    Are you checking your feet?  No      Dietary Intake   Breakfast  sausage and eggs    Snack (morning)  chips    Lunch  chicken wings and french fries    Snack (afternoon)  2 cuties or 1 banana    Dinner  chicken wings or salmon with greens and mac n cheese    Beverage(s)  coffee with creamer, water with flavorings      Exercise   Exercise Type  ADL's    How many days per week to you  exercise?  0    How many minutes per day do you exercise?  0    Total minutes per week of exercise  0      Patient Education   Previous Diabetes Education  No    Acute complications  Taught treatment of hypoglycemia - the 15 rule.;Discussed and identified patients' treatment of hyperglycemia.      Individualized Goals (developed by patient)   Nutrition  General guidelines for healthy choices and portions discussed;Follow meal plan discussed;Adjust meds/carbs with exercise as discussed    Physical Activity  Exercise 5-7 days per week;30 minutes per day    Monitoring   test my blood glucose as discussed;test blood glucose pre and post meals as discussed      Post-Education Assessment   Patient understands the diabetes disease and treatment process.  Demonstrates understanding / competency    Patient understands incorporating nutritional management  into lifestyle.  Demonstrates understanding / competency    Patient undertands incorporating physical activity into lifestyle.  Demonstrates understanding / competency    Patient understands using medications safely.  Demonstrates understanding / competency    Patient understands monitoring blood glucose, interpreting and using results  Demonstrates understanding / competency    Patient understands prevention, detection, and treatment of acute complications.  Demonstrates understanding / competency    Patient understands prevention, detection, and treatment of chronic complications.  Demonstrates understanding / competency    Patient understands how to develop strategies to address psychosocial issues.  Demonstrates understanding / competency    Patient understands how to develop strategies to promote health/change behavior.  Demonstrates understanding / competency      Outcomes   Expected Outcomes  Demonstrated interest in learning. Expect positive outcomes    Future DMSE  4-6 wks    Program Status  Completed       Individualized Plan for Diabetes Self-Management Training:   Learning Objective:  Patient will have a greater understanding of diabetes self-management. Patient education plan is to attend individual and/or group sessions per assessed needs and concerns.   Plan:   There are no Patient Instructions on file for this visit.  Expected Outcomes:  Demonstrated interest in learning. Expect positive outcomes  Education material provided: ADA - How to Thrive: A Guide for Your Journey with Diabetes and Meal plan card  If problems or questions, patient to contact team via:  Phone  Future DSME appointment: 4-6 wksPt states the glipizide keeps her awake.

## 2019-05-30 ENCOUNTER — Encounter: Payer: BC Managed Care – PPO | Attending: *Deleted | Admitting: Skilled Nursing Facility1

## 2019-05-30 ENCOUNTER — Other Ambulatory Visit: Payer: Self-pay

## 2019-05-30 DIAGNOSIS — E119 Type 2 diabetes mellitus without complications: Secondary | ICD-10-CM | POA: Diagnosis not present

## 2019-05-30 NOTE — Progress Notes (Signed)
Pt states for the last 4-5 days she has been waking up in the middle of the night with pain in her feet had a Cortizone shot about 4 weeks ago). Pt states her numbers are staying yellow and red without many greens and is now concerned especially with the left foot pain (1am) burnubg feeling with tingling toes going away when she gets up and walks around. Last 4-5 days: 11:29 335 having eaten about 10am: coffee with stevia and creamer hazelnut creamer with 2 slices bacon and 1 cutie-with fasting being 215; 10:19 242 having eaten dinner around 7:30 Pt states she does take her glipizide and rebelsus daily.    Pt states she has recently lost her grandmother. Pt states she does have days where she is wating, longer than 5 hours without eating.  Pt to be emailed with Type 2 Diabetes support group.  24 hr recall: Past week about 4 days a  Week going longer than 5 hours without eating  Educated pt on her stress level possibly contributing to her higher blood sugar numbers.   Oatmeal and orange or banana or ham biscuit with egg  Fish sandwich and fries: not eating all of it  Cheese-its (gave her heartburn)  Corn, green beans, chicken   Half of a cookie   Beverages: coffee, water   Meeting step count through helping her mom and getting "out and about": step count being   Goals: Speak with your doctor about changing up your medications or getting a referral for an endo Consider getting sweaty and out of breath activity 5 days a week to help control blood sugar numbers  Talk with your doctor about your burning/tingling toes at night On days you do not have an appetite have and will be going longer than 5 hours without eating 100% juice and nuts

## 2019-08-01 DIAGNOSIS — E1142 Type 2 diabetes mellitus with diabetic polyneuropathy: Secondary | ICD-10-CM | POA: Insufficient documentation

## 2020-01-25 IMAGING — MR MR MRA NECK WO/W CM
6 of 11 series · 19 of 48 positions shown · IV contrast (gadavist)
Comparison: Prior brain MRI from earlier the same day.

CLINICAL DATA: Initial evaluation for transient aphasia, left arm
numbness.

EXAM:
MRA NECK WITHOUT AND WITH CONTRAST
TECHNIQUE: Multiplanar and multiecho pulse sequences of the neck were obtained
without and with intravenous contrast. Angiographic images of the
neck were obtained using MRA technique without and with intravenous
contrast.
CONTRAST:  10mL GADAVIST GADOBUTROL 1 MMOL/ML IV SOLN

[Series 3: ax inhance · axial · 3.0mm · 0.55mm/px · z∈[-196,+63]mm · 8 of 180 slices shown]
[im 1/180]
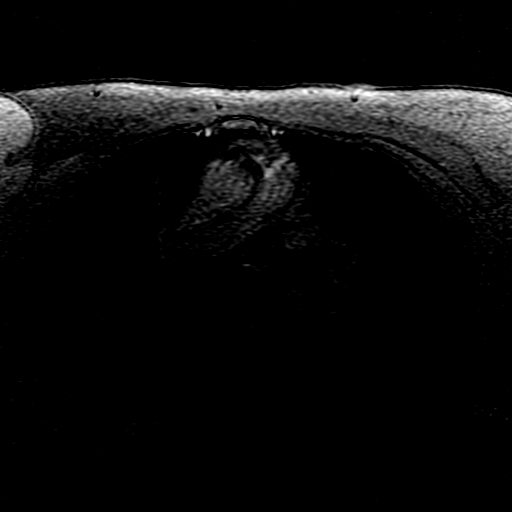
[im 20/180]
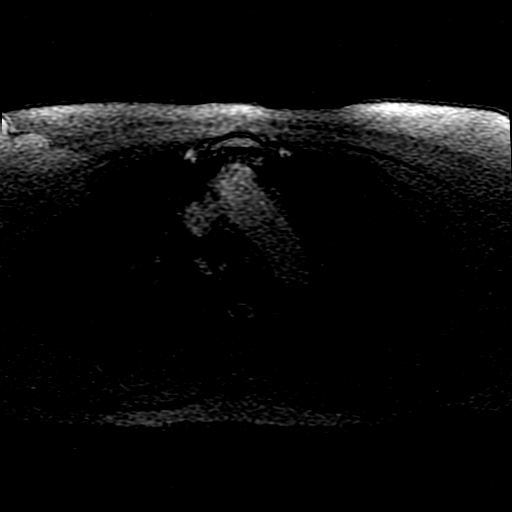
[im 60/180]
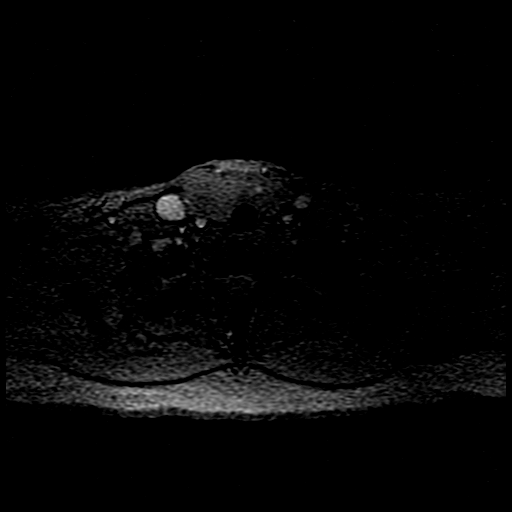
[im 80/180]
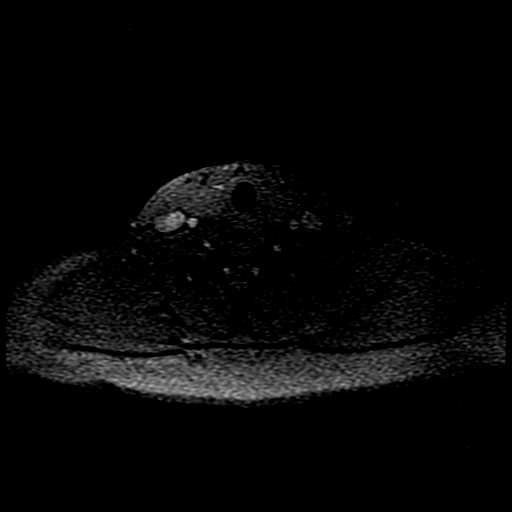
[im 100/180]
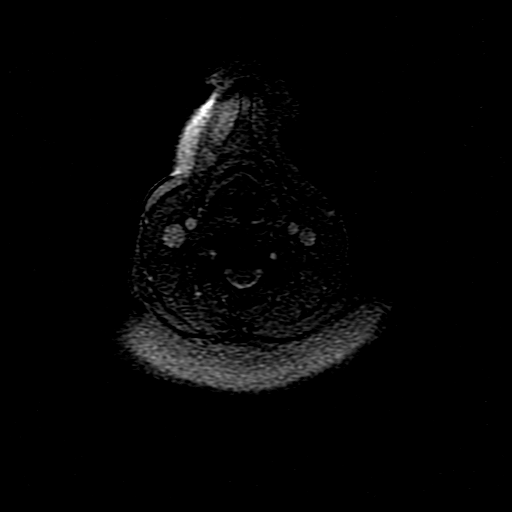
[im 120/180]
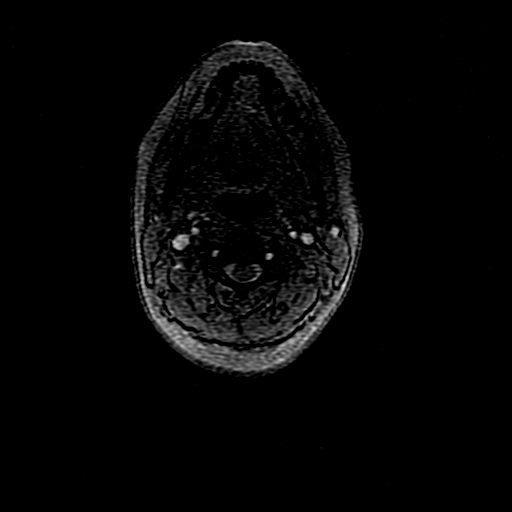
[im 160/180]
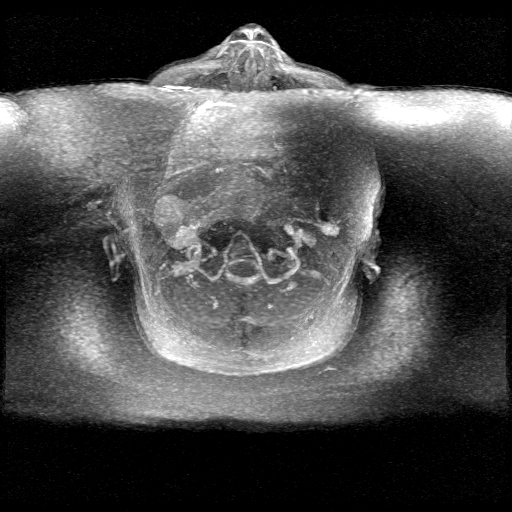
[im 180/180]
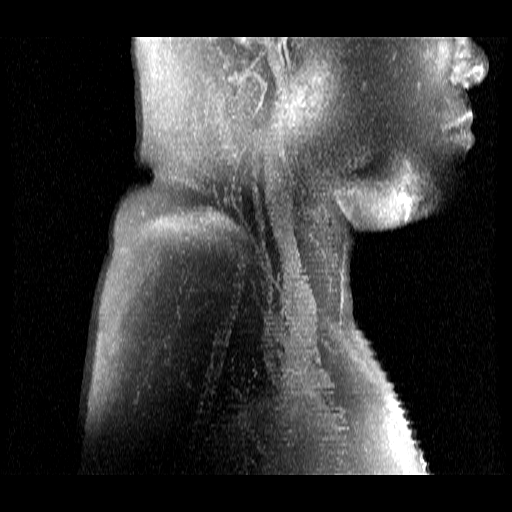

[Series 300: pjn · coronal · 3.0mm · 0.39mm/px · 1 of 19 slices shown (1 of 3)]
[im 1/19]
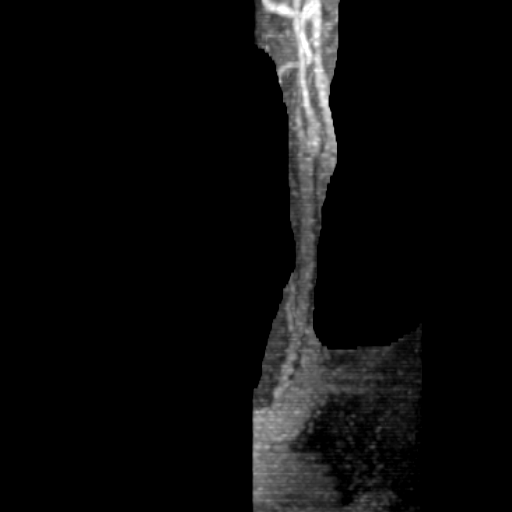

[Series 301: pjn · coronal · 3.0mm · 0.39mm/px · 1 of 19 slices shown (2 of 3)]
[im 1/19]
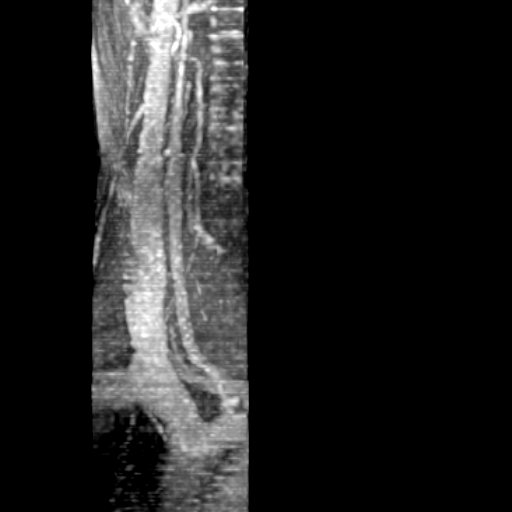

[Series 302: pjn · coronal · 3.0mm · 0.39mm/px · 1 of 14 slices shown (3 of 3)]
[im 1/14]
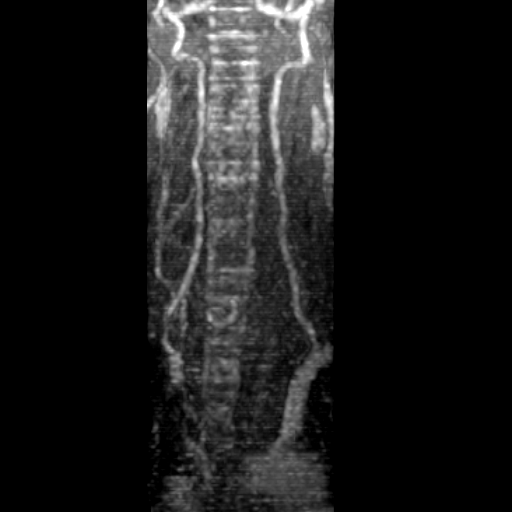

[Series 400: MRA · coronal · 1.4mm · 0.59mm/px · 5 of 92 slices shown (1 of 2)]
[im 1/92]
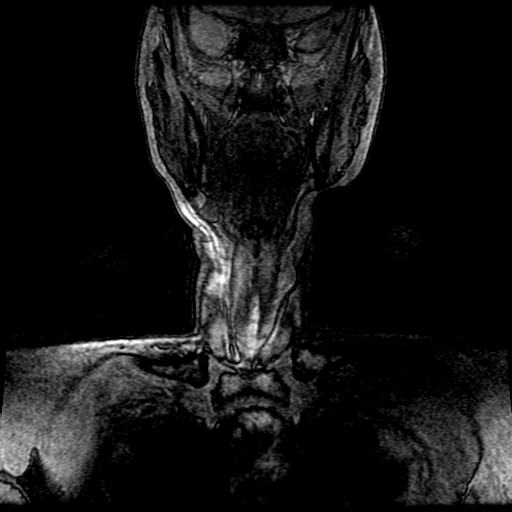
[im 23/92]
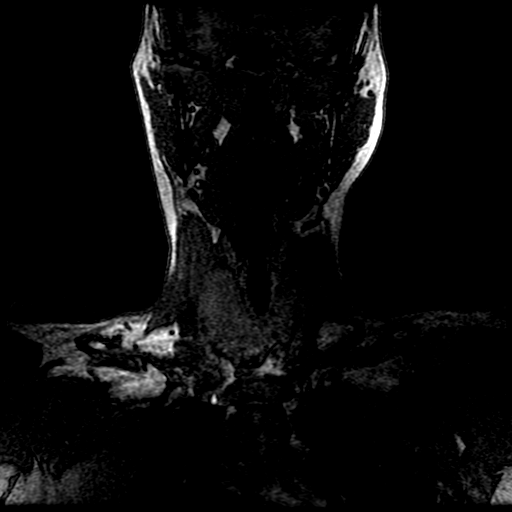
[im 46/92]
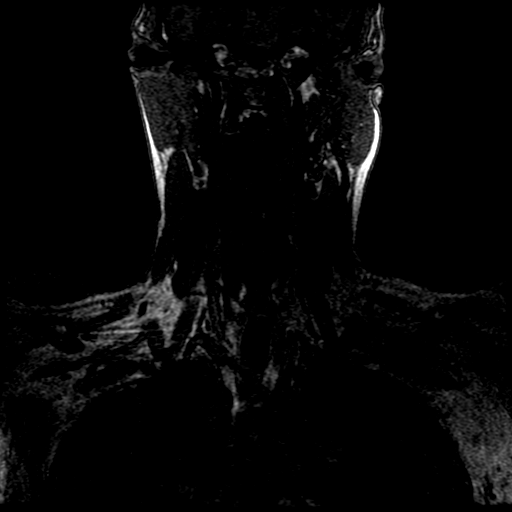
[im 69/92]
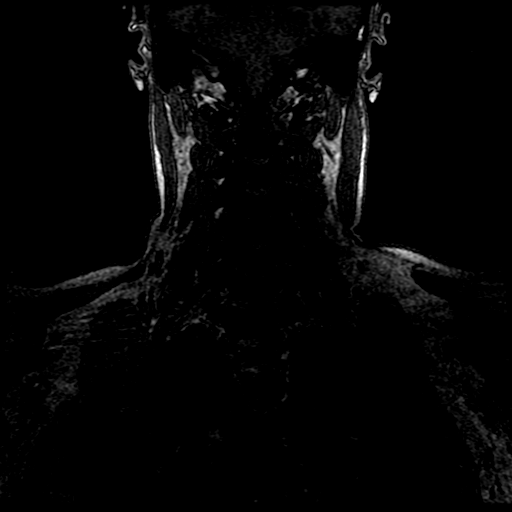
[im 92/92]
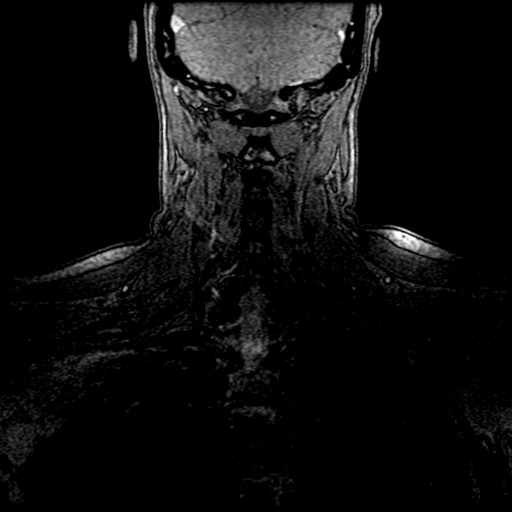

[Series 401: MRA · coronal · 1.4mm · 0.59mm/px · 3 of 92 slices shown (2 of 2)]
[im 1/92]
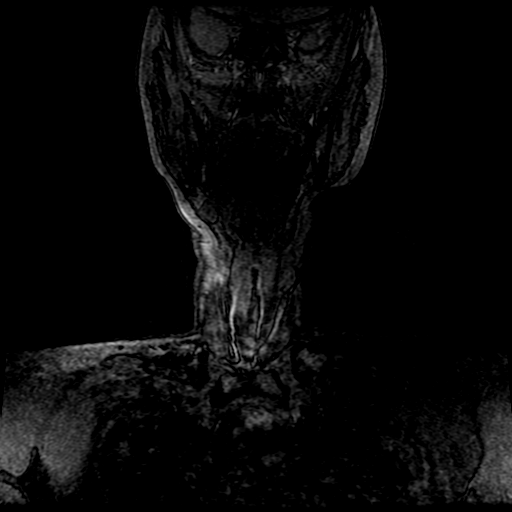
[im 23/92]
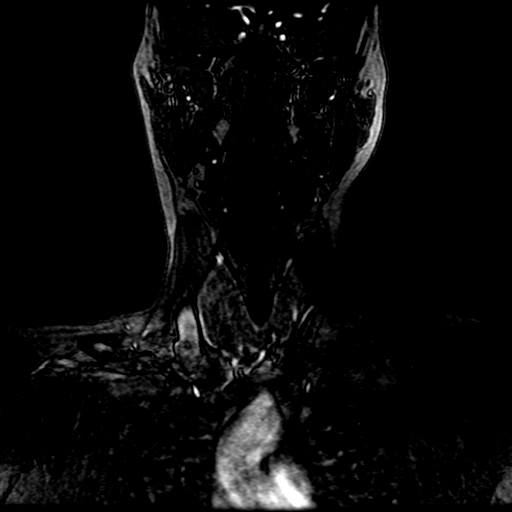
[im 46/92]
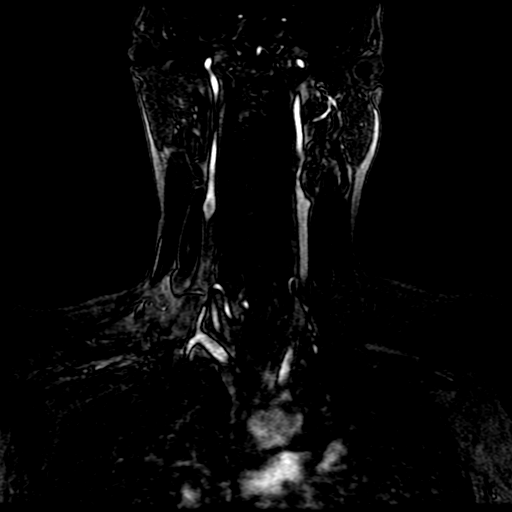

[19 of 48 positions shown; findings below may reference images not displayed]

FINDINGS: Source images reviewed.

AORTIC ARCH: Visualized aortic arch of normal caliber with normal 3
vessel morphology. No hemodynamically significant stenosis seen
about the origin of the great vessels. Visualized subclavian
arteries widely patent.

RIGHT CAROTID SYSTEM: Right common and internal carotid arteries
widely patent without stenosis or vascular occlusion. No significant
atheromatous narrowing seen about the right carotid bifurcation.

LEFT CAROTID SYSTEM: Left common and internal carotid arteries
widely patent without stenosis or vascular occlusion. No significant
atheromatous narrowing seen about the left carotid bifurcation.

VERTEBRAL ARTERIES: Both vertebral arteries arise from the
subclavian arteries. Vertebral arteries widely patent within the
neck with no evidence for stenosis or occlusion.
IMPRESSION: Normal MRA of the neck with no vascular occlusion, hemodynamically
significant stenosis, or other acute vascular abnormality.

## 2020-10-11 ENCOUNTER — Other Ambulatory Visit: Payer: Self-pay

## 2020-10-11 ENCOUNTER — Ambulatory Visit (AMBULATORY_SURGERY_CENTER): Payer: 59 | Admitting: *Deleted

## 2020-10-11 VITALS — Ht 74.0 in | Wt 189.0 lb

## 2020-10-11 DIAGNOSIS — Z1211 Encounter for screening for malignant neoplasm of colon: Secondary | ICD-10-CM

## 2020-10-11 MED ORDER — SUTAB 1479-225-188 MG PO TABS: 1.0000 | ORAL_TABLET | Freq: Once | ORAL | 0 refills | Status: AC

## 2020-10-11 NOTE — Progress Notes (Signed)
Pt's previsit is done over the phone and all paperwork (prep instructions and blank consent form to just read over) sent to patient   No egg or soy allergy  No home oxygen use   No medications for weight loss taken  emmi information given  Pt denies constipation issues  Pt informed that we do not do prior authorizations for prep   No trouble with anesthesia, denies being told they were difficult to intubate, or hx/fam hx of malignant hyperthermia per pt   Sutab code put into RX and paper copy given to pt to show pharmacy

## 2020-10-30 ENCOUNTER — Encounter: Payer: BC Managed Care – PPO | Admitting: Gastroenterology

## 2021-01-15 ENCOUNTER — Ambulatory Visit (AMBULATORY_SURGERY_CENTER): Payer: Self-pay | Admitting: *Deleted

## 2021-01-15 ENCOUNTER — Other Ambulatory Visit: Payer: Self-pay

## 2021-01-15 VITALS — Ht 74.0 in | Wt 189.0 lb

## 2021-01-15 DIAGNOSIS — Z1211 Encounter for screening for malignant neoplasm of colon: Secondary | ICD-10-CM

## 2021-01-15 MED ORDER — SUTAB 1479-225-188 MG PO TABS: 24.0000 | ORAL_TABLET | ORAL | 0 refills | Status: DC

## 2021-01-15 NOTE — Progress Notes (Signed)
Pt verified name, DOB, address and insurance during PV today.   PV completed over the phone. Pt encouraged to call with questions or issues.  Pt has her old packet in front of her at [facility] today- she states she has her consent to read, her Sutab coupon- her instructions she changed the dates for her 8-16 Colon as her time remains the same- 1130 am  no packet mailed from this Pv as pt has all her information  No egg or soy allergy known to patient  No issues with past sedation with any surgeries or procedures Patient denies ever being told they had issues or difficulty with intubation  No FH of Malignant Hyperthermia No diet pills per patient No home 02 use per patient  No blood thinners per patient  Pt denies issues with constipation  No A fib or A flutter  EMMI video to pt or via [place] 19 guidelines implemented in [place] today with Pt and RN  Pt is not vaccinated  for [employer] Code to Pharmacy and  NO PA's for preps discussed with pt In PV today  Discussed with pt there will be an out-of-pocket cost for prep and that varies from $0 to 70 dollars - pt has a coupon already   Due to the COVID-19 pandemic we are asking patients to follow certain guidelines.  Pt aware of COVID protocols and [facility] guidelines

## 2021-01-29 ENCOUNTER — Encounter: Payer: Self-pay | Admitting: Gastroenterology

## 2021-01-29 ENCOUNTER — Ambulatory Visit (AMBULATORY_SURGERY_CENTER): Payer: 59 | Admitting: Gastroenterology

## 2021-01-29 VITALS — BP 117/76 | HR 74 | Temp 97.5°F | Resp 13 | Ht 72.0 in | Wt 189.0 lb

## 2021-01-29 DIAGNOSIS — D120 Benign neoplasm of cecum: Secondary | ICD-10-CM

## 2021-01-29 DIAGNOSIS — D122 Benign neoplasm of ascending colon: Secondary | ICD-10-CM | POA: Diagnosis not present

## 2021-01-29 DIAGNOSIS — Z1211 Encounter for screening for malignant neoplasm of colon: Secondary | ICD-10-CM | POA: Diagnosis not present

## 2021-01-29 MED ORDER — SODIUM CHLORIDE 0.9 % IV SOLN: 500.0000 mL | Freq: Once | INTRAVENOUS | Status: DC

## 2021-01-29 NOTE — Progress Notes (Signed)
[facility] History and Physical   Primary Care Physician:  Everardo Beals, NP   Reason for Procedure:   Colon cancer screening - first time exam  Plan:    colonoscopy     HPI: [name] is a 50 y.o. female here for routine colon cancer screening, first time exam. No baseline bowel problems of note. No FH of colon cancer. Otherwise feels well today without complaints.   Past Medical History:  Diagnosis Date   Anxiety    Asthma    hx of   Diabetes mellitus without complication ([place])    Panic attack     Past Surgical History:  Procedure Laterality Date   CATARACT EXTRACTION, BILATERAL     DILATATION & CURETTAGE/HYSTEROSCOPY WITH MYOSURE N/A 06/21/2015   Procedure: [place];  Surgeon: Brien Few, MD;  Location: [facility];  Service: Gynecology;  Laterality: N/A;   KNEE SURGERY Right    NOVASURE ABLATION N/A 06/21/2015   Procedure: NOVASURE ABLATION;  Surgeon: Brien Few, MD;  Location: [facility];  Service: Gynecology;  Laterality: N/A;    Prior to Admission medications   Medication Sig Start Date End Date Taking? Authorizing Provider  glipiZIDE (GLUCOTROL XL) 10 MG 24 hr tablet daily.   Yes [provider]  pregabalin (LYRICA) 150 MG capsule in the morning and at bedtime.   Yes [provider]  albuterol (VENTOLIN HFA) 108 (90 Base) MCG/ACT inhaler Take 2 puffs by mouth every 4 (four) hours as needed for wheezing or shortness of breath.     [provider]  Semaglutide,0.25 or 0.'5MG'$ /DOS, (OZEMPIC, 0.25 OR 0.5 MG/DOSE,) 2 MG/1.5ML SOPN once a week.    [provider]    Current Outpatient Medications  Medication Sig Dispense Refill   glipiZIDE (GLUCOTROL XL) 10 MG 24 hr tablet daily.     pregabalin (LYRICA) 150 MG capsule in the morning and at bedtime.     albuterol (VENTOLIN HFA) 108 (90 Base) MCG/ACT inhaler Take 2 puffs by mouth every 4 (four) hours as needed for wheezing or  shortness of breath.      Semaglutide,0.25 or 0.'5MG'$ /DOS, (OZEMPIC, 0.25 OR 0.5 MG/DOSE,) 2 MG/1.5ML SOPN once a week.     Current Facility-Administered Medications  Medication Dose Route Frequency Provider Last Rate Last Admin   0.9 %  sodium chloride infusion  500 mL Intravenous Once Denitra Donaghey, Carlota Raspberry, MD        Allergies as of 01/29/2021 - Review Complete 01/29/2021  Allergen Reaction Noted   Penicillins Hives, Itching, and Swelling 12/09/2014    Family History  Problem Relation Age of Onset   Colon cancer Neg Hx    Esophageal cancer Neg Hx    Rectal cancer Neg Hx    Stomach cancer Neg Hx    Colon polyps Neg Hx     Social History   Socioeconomic History   Marital status: Single    Spouse name: Not on file   Number of children: Not on file   Years of education: Not on file   Highest education level: Not on file  Occupational History   Not on file  Tobacco Use   Smoking status: Never   Smokeless tobacco: Never  Vaping Use   Vaping Use: Never used  Substance and Sexual Activity   Alcohol use: Yes    Comment: occ   Drug use: No   Sexual activity: Not on file  Other Topics Concern   Not on file  Social  History Narrative   Not on file   Social Determinants of Health   Financial Resource Strain: Not on file  Food Insecurity: Not on file  Transportation Needs: Not on file  Physical Activity: Not on file  Stress: Not on file  Social Connections: Not on file  Intimate Partner Violence: Not on file    Review of Systems: All other review of systems negative except as mentioned in the HPI.  Physical Exam: Vital signs BP (!) 156/84   Pulse 68   Temp (!) 97.5 F (36.4 C)   Resp 12   Ht 6' (1.829 m)   Wt 189 lb (85.7 kg)   SpO2 99%   BMI 25.63 kg/m   General:   Alert,  Well-developed, well-nourished, pleasant and cooperative in NAD Lungs:  Clear throughout to auscultation.   Heart:  Regular rate and rhythm;  Abdomen:  Soft, nontender and nondistended.  Normal bowel sounds.   Neuro/Psych:  Alert and cooperative. Normal mood and affect. A and O x 3  Jolly Mango, MD [facility]

## 2021-01-29 NOTE — Progress Notes (Signed)
Report given to PACU, vss 

## 2021-01-29 NOTE — Progress Notes (Signed)
Called to room to assist during endoscopic procedure.  Patient ID and intended procedure confirmed with present staff. Received instructions for my participation in the procedure from the performing physician.  

## 2021-01-29 NOTE — Progress Notes (Signed)
VS taken by C.W. 

## 2021-01-29 NOTE — Patient Instructions (Signed)
Handout given for Polyps.  YOU HAD AN ENDOSCOPIC PROCEDURE TODAY AT [facility]:   Refer to the procedure report that was given to you for any specific questions about what was found during the examination.  If the procedure report does not answer your questions, please call your gastroenterologist to clarify.  If you requested that your care partner not be given the details of your procedure findings, then the procedure report has been included in a sealed envelope for you to review at your convenience later.  YOU SHOULD EXPECT: Some feelings of bloating in the abdomen. Passage of more gas than usual.  Walking can help get rid of the air that was put into your GI tract during the procedure and reduce the bloating. If you had a lower endoscopy (such as a colonoscopy or flexible sigmoidoscopy) you may notice spotting of blood in your stool or on the toilet paper. If you underwent a bowel prep for your procedure, you may not have a normal bowel movement for a few days.  Please Note:  You might notice some irritation and congestion in your nose or some drainage.  This is from the oxygen used during your procedure.  There is no need for concern and it should clear up in a day or so.  SYMPTOMS TO REPORT IMMEDIATELY:  Following lower endoscopy (colonoscopy or flexible sigmoidoscopy):  Excessive amounts of blood in the stool  Significant tenderness or worsening of abdominal pains  Swelling of the abdomen that is new, acute  Fever of 100F or higher  For urgent or emergent issues, a gastroenterologist can be reached at any hour by calling [phone]. Do not use MyChart messaging for urgent concerns.    DIET:  We do recommend a small meal at first, but then you may proceed to your regular diet.  Drink plenty of fluids but you should avoid alcoholic beverages for 24 hours.  ACTIVITY:  You should plan to take it easy for the rest of today and you should NOT DRIVE or use heavy  machinery until tomorrow (because of the sedation medicines used during the test).    FOLLOW UP: Our staff will call the number listed on your records 48-72 hours following your procedure to check on you and address any questions or concerns that you may have regarding the information given to you following your procedure. If we do not reach you, we will leave a message.  We will attempt to reach you two times.  During this call, we will ask if you have developed any symptoms of COVID 19. If you develop any symptoms (ie: fever, flu-like symptoms, shortness of breath, cough etc.) before then, please call [phone].  If you test positive for Covid 19 in the 2 weeks post procedure, please call and report this information to [place].    If any biopsies were taken you will be contacted by phone or by letter within the next 1-3 weeks.  Please call us at [phone] if you have not heard about the biopsies in 3 weeks.    SIGNATURES/CONFIDENTIALITY: You and/or your care partner have signed paperwork which will be entered into your electronic medical record.  These signatures attest to the fact that that the information above on your After Visit Summary has been reviewed and is understood.  Full responsibility of the confidentiality of this discharge information lies with you and/or your care-partner.

## 2021-01-29 NOTE — Progress Notes (Signed)
Pt's states no medical or surgical changes since previsit or office visit. 

## 2021-01-29 NOTE — Op Note (Signed)
[facility] Patient Name: [name] Procedure Date: 01/29/2021 12:00 PM MRN: [id number] Endoscopist: Remo Lipps P. Havery Moros , MD Age: 50 Referring MD:  Date of Birth: [birth year] Gender: Female Account #: [id number] Procedure:                Colonoscopy Indications:              Screening for colorectal malignant neoplasm, This                            is the patient's first colonoscopy Medicines:                Monitored Anesthesia Care Procedure:                Pre-Anesthesia Assessment:                           - Prior to the procedure, a History and Physical                            was performed, and patient medications and                            allergies were reviewed. The patient's tolerance of                            previous anesthesia was also reviewed. The risks                            and benefits of the procedure and the sedation                            options and risks were discussed with the patient.                            All questions were answered, and informed consent                            was obtained. Prior Anticoagulants: The patient has                            taken no previous anticoagulant or antiplatelet                            agents. ASA Grade Assessment: III - A patient with                            severe systemic disease. After reviewing the risks                            and benefits, the patient was deemed in                            satisfactory condition to undergo the procedure.  After obtaining informed consent, the colonoscope                            was passed under direct vision. Throughout the                            procedure, the patient's blood pressure, pulse, and                            oxygen saturations were monitored continuously. The                            Olympus CF-[id number]) Colonoscope was                            introduced through the  anus and advanced to the the                            cecum, identified by appendiceal orifice and                            ileocecal valve. The colonoscopy was performed                            without difficulty. The patient tolerated the                            procedure well. The quality of the bowel                            preparation was adequate. The ileocecal valve,                            appendiceal orifice, and rectum were photographed. Scope In: 12:08:35 PM Scope Out: 12:30:11 PM Scope Withdrawal Time: 0 hours 17 minutes 44 seconds  Total Procedure Duration: 0 hours 21 minutes 36 seconds  Findings:                 The perianal and digital rectal examinations were                            normal.                           A 3 mm polyp was found in the ileocecal valve. The                            polyp was sessile. The polyp was removed with a                            cold snare. Resection and retrieval were complete.                           A 3 mm polyp was found in the ascending colon. The  polyp was sessile. The polyp was removed with a                            cold snare. Resection and retrieval were complete.                           A large amount of liquid stool was found in the                            entire colon, making visualization difficult.                            Lavage of the colon was performed using copious                            amounts of sterile water, resulting in clearance                            with adequate visualization. This took several                            minutes to clear the colon.                           The exam was otherwise without abnormality. Complications:            No immediate complications. Estimated blood loss:                            Minimal. Estimated Blood Loss:     Estimated blood loss was minimal. Impression:               - One 3 mm polyp at the  ileocecal valve, removed                            with a cold snare. Resected and retrieved.                           - One 3 mm polyp in the ascending colon, removed                            with a cold snare. Resected and retrieved.                           - Stool in the entire examined colon leading to                            lavage with adequate views.                           - The examination was otherwise normal. Recommendation:           - Patient has a contact number available for  emergencies. The signs and symptoms of potential                            delayed complications were discussed with the                            patient. Return to normal activities tomorrow.                            Written discharge instructions were provided to the                            patient.                           - Resume previous diet.                           - Continue present medications.                           - Await pathology results. Remo Lipps P. Taysha Majewski, MD 01/29/2021 12:34:03 PM This report has been signed electronically.

## 2021-01-31 ENCOUNTER — Telehealth: Payer: Self-pay

## 2021-01-31 NOTE — Telephone Encounter (Signed)
Left message on answering machine. 

## 2021-01-31 NOTE — Telephone Encounter (Signed)
Patient returned call. States she is feeling fine but one issue is she have not had a bowel movement. Have questions about how long before she is to have one. She have been eating normally. Best contact number [phone]

## 2021-01-31 NOTE — Telephone Encounter (Signed)
Returned pt's call. Assured her that it was normal that she has not yet had a BM after the colonoscopy. Stated it can take a few days for normal bowel function to return, but if she is feeling bloated or constipated she could take an OTC laxative to help her bowels move. Pt asked if it was okay to start Metamucil daily to help get her bowels more regular as she currently doesn't have a BM every day. RN instructed pt that there would be no issues with starting Metamucil at this time. Instructed pt to call back if any further questions or concerns.

## 2021-02-03 ENCOUNTER — Encounter: Payer: Self-pay | Admitting: Gastroenterology

## 2021-02-03 NOTE — Progress Notes (Signed)
Path letter sent with results

## 2021-08-13 ENCOUNTER — Encounter: Payer: Self-pay | Admitting: *Deleted

## 2021-08-16 ENCOUNTER — Ambulatory Visit: Payer: 59 | Admitting: Diagnostic Neuroimaging

## 2021-08-16 ENCOUNTER — Encounter: Payer: Self-pay | Admitting: Diagnostic Neuroimaging

## 2021-08-16 VITALS — BP 132/80 | HR 76 | Ht 74.0 in | Wt 230.0 lb

## 2021-08-16 DIAGNOSIS — E1142 Type 2 diabetes mellitus with diabetic polyneuropathy: Secondary | ICD-10-CM | POA: Diagnosis not present

## 2021-08-16 DIAGNOSIS — R200 Anesthesia of skin: Secondary | ICD-10-CM

## 2021-08-16 MED ORDER — DULOXETINE HCL 30 MG PO CPEP: 30.0000 mg | ORAL_CAPSULE | Freq: Every day | ORAL | 3 refills | Status: AC

## 2021-08-16 NOTE — Patient Instructions (Signed)
?  Painful diabetic neuropathy treatment options: ? ?- start duloxetine 30 daily ? ?- continue pregabalin 150mg  twice a day ? ?- capsaicin cream, lidocaine patch / cream, alpha-lipoic acid 600mg  daily  ?

## 2021-08-16 NOTE — Progress Notes (Signed)
° °[facility] ° °PATIENT: [name] °DOB: 08/06/1970 ° °REFERRING CLINICIAN: Millsaps, Kimberly, NP °HISTORY FROM: patient  °REASON FOR VISIT: new consult ° ° °HISTORICAL ° °CHIEF COMPLAINT:  °Chief Complaint  °Patient presents with  ° New Patient (Initial Visit)  °  Rm 7 here for consult on diabetic neuropathy. Pt reports sx have been on going for 2 years now. She is on Lyrica 150 mg bid reports med helps dull sx. Feel like left foot is worse than the right.  ° ° °HISTORY OF PRESENT ILLNESS:  ° °51-year-old female here for diabetic neuropathy evaluation. ° °History of diabetes since 2020, with initial hemoglobin A1c greater than 14.  Now with hemoglobin A1c ranging from 6.1 up to 7.3 recently.  Over the past 2 years has developed onset of numbness, tingling, burning, pins-and-needles sensation in the toes and feet.  Symptoms are fluctuating.  Patient tried gabapentin without relief.  She is now on Lyrica 150 mg twice a day with some reduction in severe burning pain.  Still has some mild numbness and tingling.  Has some balance issues.  No problems with fingers or hands. ° ° °REVIEW OF SYSTEMS: Full 14 system review of systems performed and negative with exception of: as per HPI. ° °ALLERGIES: °Allergies  °Allergen Reactions  ° Penicillins Hives, Itching and Swelling  °  Has patient had a PCN reaction causing immediate rash, facial/tongue/throat swelling, SOB or lightheadedness with hypotension: Yes °Has patient had a PCN reaction causing severe rash involving mucus membranes or skin necrosis: No °Has patient had a PCN reaction that required hospitalization No °Has patient had a PCN reaction occurring within the last 10 years: No °If all of the above answers are "NO", then may proceed with Cephalosporin use. °  ° ° °HOME MEDICATIONS: °Outpatient Medications Prior to Visit  °Medication Sig Dispense Refill  ° pregabalin (LYRICA) 150 MG capsule in the morning and at bedtime.    ° Semaglutide,0.25  or 0.5MG/DOS, (OZEMPIC, 0.25 OR 0.5 MG/DOSE,) 2 MG/1.5ML SOPN once a week.    ° albuterol (VENTOLIN HFA) 108 (90 Base) MCG/ACT inhaler Take 2 puffs by mouth every 4 (four) hours as needed for wheezing or shortness of breath.     ° glipiZIDE (GLUCOTROL XL) 10 MG 24 hr tablet daily.    ° °No facility-administered medications prior to visit.  ° ° °PAST MEDICAL HISTORY: °Past Medical History:  °Diagnosis Date  ° Anxiety   ° Asthma   ° hx of  ° Diabetes mellitus without complication (HCC)   ° Diabetic peripheral neuropathy (HCC)   ° Panic attack   ° ° °PAST SURGICAL HISTORY: °Past Surgical History:  °Procedure Laterality Date  ° CATARACT EXTRACTION, BILATERAL    ° DILATATION & CURETTAGE/HYSTEROSCOPY WITH MYOSURE N/A 06/21/2015  ° Procedure: DILATATION & CURETTAGE/HYSTEROSCOPY WITH MYOSURE;  Surgeon: Richard Taavon, MD;  Location: [facility];  Service: Gynecology;  Laterality: N/A;  ° KNEE SURGERY Right   ° NOVASURE ABLATION N/A 06/21/2015  ° Procedure: NOVASURE ABLATION;  Surgeon: Richard Taavon, MD;  Location: [facility];  Service: Gynecology;  Laterality: N/A;  ° TUBAL LIGATION    ° ° °FAMILY HISTORY: °Family History  °Problem Relation Age of Onset  ° Hypertension Sister   ° Prostate cancer Maternal Grandfather   ° Colon cancer Neg Hx   ° Esophageal cancer Neg Hx   ° Rectal cancer Neg Hx   ° Stomach cancer Neg Hx   ° Colon polyps Neg Hx   ° ° °  SOCIAL HISTORY: °Social History  ° °Socioeconomic History  ° Marital status: Single  °  Spouse name: Not on file  ° Number of children: Not on file  ° Years of education: Not on file  ° Highest education level: Not on file  °Occupational History  ° Not on file  °Tobacco Use  ° Smoking status: Never  ° Smokeless tobacco: Never  °Vaping Use  ° Vaping Use: Never used  °Substance and Sexual Activity  ° Alcohol use: Yes  °  Comment: occ  ° Drug use: No  ° Sexual activity: Not on file  °Other Topics Concern  ° Not on file  °Social History Narrative  ° Right handed   ° Some Caffeine   ° °Social  Determinants of Health  ° °Financial Resource Strain: Not on file  °Food Insecurity: Not on file  °Transportation Needs: Not on file  °Physical Activity: Not on file  °Stress: Not on file  °Social Connections: Not on file  °Intimate Partner Violence: Not on file  ° ° ° °PHYSICAL EXAM ° °GENERAL EXAM/CONSTITUTIONAL: °Vitals:  °Vitals:  ° 08/16/21 0822  °BP: 132/80  °Pulse: 76  °Weight: 230 lb (104.3 kg)  °Height: 6' 2" (1.88 m)  ° °Body mass index is 29.53 kg/m². °Wt Readings from Last 3 Encounters:  °08/16/21 230 lb (104.3 kg)  °01/29/21 189 lb (85.7 kg)  °01/15/21 189 lb (85.7 kg)  ° °Patient is in no distress; well developed, nourished and groomed; neck is supple ° °CARDIOVASCULAR: °Examination of carotid arteries is normal; no carotid bruits °Regular rate and rhythm, no murmurs °Examination of peripheral vascular system by observation and palpation is normal ° °EYES: °Ophthalmoscopic exam of optic discs and posterior segments is normal; no papilledema or hemorrhages °No results found. ° °MUSCULOSKELETAL: °Gait, strength, tone, movements noted in Neurologic exam below ° °NEUROLOGIC: °MENTAL STATUS:  °No flowsheet data found. °awake, alert, oriented to person, place and time °recent and remote memory intact °normal attention and concentration °language fluent, comprehension intact, naming intact °fund of knowledge appropriate ° °CRANIAL NERVE:  °2nd - no papilledema on fundoscopic exam °2nd, 3rd, 4th, 6th - pupils equal and reactive to light, visual fields full to confrontation, extraocular muscles intact, no nystagmus °5th - facial sensation symmetric °7th - facial strength symmetric °8th - hearing intact °9th - palate elevates symmetrically, uvula midline °11th - shoulder shrug symmetric °12th - tongue protrusion midline ° °MOTOR:  °normal bulk and tone, full strength in the BUE, BLE ° °SENSORY:  °normal and symmetric to light touch, temperature, vibration ° °COORDINATION:  °finger-nose-finger, fine finger  movements normal ° °REFLEXES:  °deep tendon reflexes TRACE and symmetric ° °GAIT/STATION:  °narrow based gait ° ° ° ° °DIAGNOSTIC DATA (LABS, IMAGING, TESTING) °- I reviewed patient records, labs, notes, testing and imaging myself where available. ° °Lab Results  °Component Value Date  ° WBC 5.8 03/09/2019  ° HGB 12.3 03/09/2019  ° HCT 37.6 03/09/2019  ° MCV 98.4 03/09/2019  ° PLT 245 03/09/2019  ° °   °Component Value Date/Time  ° NA 137 03/09/2019 1957  ° K 3.3 (L) 03/09/2019 1957  ° CL 111 03/09/2019 1957  ° CO2 17 (L) 03/09/2019 1957  ° GLUCOSE 399 (H) 03/09/2019 1957  ° BUN 9 03/09/2019 1957  ° CREATININE 0.72 03/09/2019 1957  ° CALCIUM 6.9 (L) 03/09/2019 1957  ° PROT 5.3 (L) 03/09/2019 1957  ° ALBUMIN 2.8 (L) 03/09/2019 1957  ° AST 18 03/09/2019 1957  ° ALT 12   03/09/2019 1957  ° ALKPHOS 57 03/09/2019 1957  ° BILITOT 0.9 03/09/2019 1957  ° GFRNONAA >60 03/09/2019 1957  ° GFRAA >60 03/09/2019 1957  ° °No results found for: CHOL, HDL, LDLCALC, LDLDIRECT, TRIG, CHOLHDL °Lab Results  °Component Value Date  ° HGBA1C 14.1 (H) 03/09/2019  ° °No results found for: VITAMINB12 °No results found for: TSH ° °Jan 2023 A1c - 7.3 ° ° ° °ASSESSMENT AND PLAN ° °51 y.o. year old female here with mild diabetic peripheral neuropathy. ° ° °Dx: ° °1. Numbness in feet   °2. Diabetic polyneuropathy associated with type 2 diabetes mellitus (HCC)   ° ° °PLAN: ° °NUMBNESS / TINGLING °- check neuropathy labs (rule out other causes of neuropathy) ° °Painful diabetic neuropathy treatment options: °- start duloxetine 30 daily °- continue pregabalin 150mg twice a day °- OTC capsaicin cream, lidocaine patch / cream, alpha-lipoic acid 600mg daily  ° °Orders Placed This Encounter  °Procedures  ° CBC with diff  ° CMP  ° Vitamin B12  ° A1c  ° TSH  ° SPEP with IFE  ° ANA w/Reflex  ° SSA, SSB  ° ESR  ° CRP  ° RPR  ° HIV  ° ANCA Profile  ° Vitamin B6  ° Vitamin B1  ° °Meds ordered this encounter  °Medications  ° DULoxetine (CYMBALTA) 30 MG capsule  °   Sig: Take 1 capsule (30 mg total) by mouth daily.  °  Dispense:  30 capsule  °  Refill:  3  ° °Return for pending if symptoms worsen or fail to improve. ° ° ° °VIKRAM R. PENUMALLI, MD 08/16/2021, 9:37 AM °Certified in Neurology, Neurophysiology and Neuroimaging ° °[facility] °[address], [address] °[place], [place] [address] °[phone] ° °

## 2021-08-23 LAB — C-REACTIVE PROTEIN: CRP: <1 mg/L (ref 0–10)

## 2021-08-23 LAB — HEMOGLOBIN A1C
Est. average glucose Bld gHb Est-mCnc: 166 mg/dL
Hgb A1c MFr Bld: 7.4 % — ABNORMAL HIGH (ref 4.8–5.6)

## 2021-08-23 LAB — COMPREHENSIVE METABOLIC PANEL
ALT: 9 IU/L (ref 0–32)
AST: 15 IU/L (ref 0–40)
Albumin/Globulin Ratio: 1.6 (ref 1.2–2.2)
Albumin: 4.4 g/dL (ref 3.8–4.9)
Alkaline Phosphatase: 78 IU/L (ref 44–121)
BUN/Creatinine Ratio: 10 (ref 9–23)
BUN: 11 mg/dL (ref 6–24)
Bilirubin Total: 1 mg/dL (ref 0.0–1.2)
CO2: 25 mmol/L (ref 20–29)
Calcium: 9.3 mg/dL (ref 8.7–10.2)
Chloride: 102 mmol/L (ref 96–106)
Creatinine, Ser: 1.13 mg/dL — ABNORMAL HIGH (ref 0.57–1.00)
Globulin, Total: 2.8 g/dL (ref 1.5–4.5)
Glucose: 221 mg/dL — ABNORMAL HIGH (ref 70–99)
Potassium: 4.1 mmol/L (ref 3.5–5.2)
Sodium: 140 mmol/L (ref 134–144)
Total Protein: 7.2 g/dL (ref 6.0–8.5)
eGFR: 59 mL/min/{1.73_m2} — ABNORMAL LOW (ref >59)

## 2021-08-23 LAB — MULTIPLE MYELOMA PANEL, SERUM
Albumin SerPl Elph-Mcnc: 3.8 g/dL (ref 2.9–4.4)
Albumin/Glob SerPl: 1.2 (ref 0.7–1.7)
Alpha 1: 0.2 g/dL (ref 0.0–0.4)
Alpha2 Glob SerPl Elph-Mcnc: 0.8 g/dL (ref 0.4–1.0)
B-Globulin SerPl Elph-Mcnc: 1.1 g/dL (ref 0.7–1.3)
Gamma Glob SerPl Elph-Mcnc: 1.4 g/dL (ref 0.4–1.8)
Globulin, Total: 3.4 g/dL (ref 2.2–3.9)
IgA/Immunoglobulin A, Serum: 410 mg/dL — ABNORMAL HIGH (ref 87–352)
IgG (Immunoglobin G), Serum: 1283 mg/dL (ref 586–1602)
IgM (Immunoglobulin M), Srm: 74 mg/dL (ref 26–217)

## 2021-08-23 LAB — CBC WITH DIFFERENTIAL/PLATELET
Basophils Absolute: 0 10*3/uL (ref 0.0–0.2)
Basos: 0 %
EOS (ABSOLUTE): 0.1 10*3/uL (ref 0.0–0.4)
Eos: 1 %
Hematocrit: 37.5 % (ref 34.0–46.6)
Hemoglobin: 12.1 g/dL (ref 11.1–15.9)
Immature Grans (Abs): 0 10*3/uL (ref 0.0–0.1)
Immature Granulocytes: 0 %
Lymphocytes Absolute: 2 10*3/uL (ref 0.7–3.1)
Lymphs: 39 %
MCH: 30.2 pg (ref 26.6–33.0)
MCHC: 32.3 g/dL (ref 31.5–35.7)
MCV: 94 fL (ref 79–97)
Monocytes Absolute: 0.6 10*3/uL (ref 0.1–0.9)
Monocytes: 12 %
Neutrophils Absolute: 2.4 10*3/uL (ref 1.4–7.0)
Neutrophils: 48 %
Platelets: 289 10*3/uL (ref 150–450)
RBC: 4.01 x10E6/uL (ref 3.77–5.28)
RDW: 11.6 % — ABNORMAL LOW (ref 11.7–15.4)
WBC: 5.1 10*3/uL (ref 3.4–10.8)

## 2021-08-23 LAB — ANCA PROFILE
Anti-MPO Antibodies: 1.1 units — ABNORMAL HIGH (ref 0.0–0.9)
Anti-PR3 Antibodies: <0.2 units (ref 0.0–0.9)
Atypical pANCA: <1:20 {titer}
C-ANCA: <1:20 {titer}
P-ANCA: <1:20 {titer}

## 2021-08-23 LAB — VITAMIN B12: Vitamin B-12: 560 pg/mL (ref 232–1245)

## 2021-08-23 LAB — VITAMIN B6: Vitamin B6: 6.1 ug/L (ref 3.4–65.2)

## 2021-08-23 LAB — ANA W/REFLEX: ANA Titer 1: POSITIVE — AB

## 2021-08-23 LAB — ENA+DNA/DS+ANTICH+CENTRO+FA...
Anti JO-1: <0.2 AI (ref 0.0–0.9)
Antiribosomal P Antibodies: <0.2 AI (ref 0.0–0.9)
Centromere Ab Screen: <0.2 AI (ref 0.0–0.9)
Chromatin Ab SerPl-aCnc: <0.2 AI (ref 0.0–0.9)
ENA RNP Ab: <0.2 AI (ref 0.0–0.9)
ENA SM Ab Ser-aCnc: <0.2 AI (ref 0.0–0.9)
Scleroderma (Scl-70) (ENA) Antibody, IgG: <0.2 AI (ref 0.0–0.9)
Smith/RNP Antibodies: <0.2 AI (ref 0.0–0.9)
Speckled Pattern: 1:640 {titer} — ABNORMAL HIGH
dsDNA Ab: 1 IU/mL (ref 0–9)

## 2021-08-23 LAB — VITAMIN B1: Thiamine: 111.6 nmol/L (ref 66.5–200.0)

## 2021-08-23 LAB — TSH: TSH: 1.2 u[IU]/mL (ref 0.450–4.500)

## 2021-08-23 LAB — HIV ANTIBODY (ROUTINE TESTING W REFLEX): HIV Screen 4th Generation wRfx: NONREACTIVE

## 2021-08-23 LAB — RPR: RPR Ser Ql: NONREACTIVE

## 2021-08-23 LAB — SEDIMENTATION RATE: Sed Rate: 7 mm/hr (ref 0–40)

## 2021-08-23 LAB — SJOGREN'S SYNDROME ANTIBODS(SSA + SSB)
ENA SSA (RO) Ab: <0.2 AI (ref 0.0–0.9)
ENA SSB (LA) Ab: <0.2 AI (ref 0.0–0.9)

## 2021-08-26 ENCOUNTER — Telehealth: Payer: Self-pay

## 2021-08-26 DIAGNOSIS — R200 Anesthesia of skin: Secondary | ICD-10-CM

## 2021-08-26 DIAGNOSIS — R768 Other specified abnormal immunological findings in serum: Secondary | ICD-10-CM

## 2021-08-26 NOTE — Telephone Encounter (Signed)
-----   Message from Penni Bombard, MD sent at 08/25/2021  4:55 PM EDT ----- ?Diabetes is imrpoving. ANA is positive; could represent other autoimmune issue. Other labs ok, with minor changes not likely significant. Consider rheumatology consult. -VRP ?

## 2021-08-26 NOTE — Telephone Encounter (Signed)
I called the pt and left vm ( ok per dpr) advising of results. Pt advised to call back if she had questions and if she would like to pursue rheumatology referral.  ?

## 2021-08-27 NOTE — Telephone Encounter (Signed)
Pt returned my call and we discussed results. She is agreeable to pursing Rheumatology referral. I have place. Pt will call back if she has not heard about scheduling in 1 week. ?

## 2021-08-27 NOTE — Addendum Note (Signed)
Addended by: Verlin Grills on: 08/27/2021 04:17 PM ? ? Modules accepted: Orders ? ?

## 2022-01-09 NOTE — Progress Notes (Deleted)
Office Visit Note  Patient: [name]             Date of Birth: [birth year]           MRN: [id number]             PCP: Everardo Beals, NP Referring: Penni Bombard, MD Visit Date: 01/20/2022 Occupation: @GUAROCC @  Subjective:  No chief complaint on file.   History of Present Illness: [name] is a 51 y.o. female ***   Activities of Daily Living:  Patient reports morning stiffness for *** {minute/hour:19697}.   Patient {ACTIONS;DENIES/REPORTS:21021675::"Denies"} nocturnal pain.  Difficulty dressing/grooming: {ACTIONS;DENIES/REPORTS:21021675::"Denies"} Difficulty climbing stairs: {ACTIONS;DENIES/REPORTS:21021675::"Denies"} Difficulty getting out of chair: {ACTIONS;DENIES/REPORTS:21021675::"Denies"} Difficulty using hands for taps, buttons, cutlery, and/or writing: {ACTIONS;DENIES/REPORTS:21021675::"Denies"}  No Rheumatology ROS completed.   PMFS History:  There are no problems to display for this patient.   Past Medical History:  Diagnosis Date  . Anxiety   . Asthma    hx of  . Diabetes mellitus without complication ([place])   . Diabetic peripheral neuropathy ([place])   . Panic attack     Family History  Problem Relation Age of Onset  . Hypertension Sister   . Prostate cancer Maternal Grandfather   . Colon cancer Neg Hx   . Esophageal cancer Neg Hx   . Rectal cancer Neg Hx   . Stomach cancer Neg Hx   . Colon polyps Neg Hx    Past Surgical History:  Procedure Laterality Date  . CATARACT EXTRACTION, BILATERAL    . DILATATION & CURETTAGE/HYSTEROSCOPY WITH MYOSURE N/A 06/21/2015   Procedure: DILATATION & CURETTAGE/HYSTEROSCOPY WITH MYOSURE;  Surgeon: Brien Few, MD;  Location: [facility];  Service: Gynecology;  Laterality: N/A;  . KNEE SURGERY Right   . NOVASURE ABLATION N/A 06/21/2015   Procedure: NOVASURE ABLATION;  Surgeon: Brien Few, MD;  Location: [facility];  Service: Gynecology;  Laterality: N/A;  . TUBAL LIGATION     Social History   Social  History Narrative   Right handed    Some Caffeine     There is no immunization history on file for this patient.   Objective: Vital Signs: There were no vitals taken for this visit.   Physical Exam   Musculoskeletal Exam: ***  CDAI Exam: CDAI Score: -- Patient Global: --; Provider Global: -- Swollen: --; Tender: -- Joint Exam 01/20/2022   No joint exam has been documented for this visit   There is currently no information documented on the homunculus. Go to the Rheumatology activity and complete the homunculus joint exam.  Investigation: No additional findings.  Imaging: No results found.  Recent Labs: Lab Results  Component Value Date   WBC 5.1 08/16/2021   HGB 12.1 08/16/2021   PLT 289 08/16/2021   NA 140 08/16/2021   K 4.1 08/16/2021   CL 102 08/16/2021   CO2 25 08/16/2021   GLUCOSE 221 (H) 08/16/2021   BUN 11 08/16/2021   CREATININE 1.13 (H) 08/16/2021   BILITOT 1.0 08/16/2021   ALKPHOS 78 08/16/2021   AST 15 08/16/2021   ALT 9 08/16/2021   PROT 7.2 08/16/2021   ALBUMIN 4.4 08/16/2021   CALCIUM 9.3 08/16/2021   GFRAA >60 03/09/2019    Speciality Comments: No specialty comments available.  Procedures:  No procedures performed Allergies: Penicillins   Assessment / Plan:     Visit Diagnoses: Positive ANA (antinuclear antibody) - 08/16/21: ANA 1:640 speckled, dsDNA 1, RNP-, Sm-, SM/RNP-, Scl-70-, Chromatin ab-, antiribosomal P-, Anti-JO1-, Centromere-,  MPO-, ANCA-, Ro-, La-, ESR 7, CRP<1  Numbness in feet  Orders: No orders of the defined types were placed in this encounter.  No orders of the defined types were placed in this encounter.   Face-to-face time spent with patient was *** minutes. Greater than 50% of time was spent in counseling and coordination of care.  Follow-Up Instructions: No follow-ups on file.   Ofilia Neas, PA-C  Note - This record has been created using Dragon software.  Chart creation errors have been sought, but may  not always  have been located. Such creation errors do not reflect on  the standard of medical care.

## 2022-01-20 ENCOUNTER — Ambulatory Visit: Payer: 59 | Admitting: Rheumatology

## 2022-01-20 DIAGNOSIS — R768 Other specified abnormal immunological findings in serum: Secondary | ICD-10-CM

## 2022-01-20 DIAGNOSIS — R200 Anesthesia of skin: Secondary | ICD-10-CM

## 2022-01-20 DIAGNOSIS — E1142 Type 2 diabetes mellitus with diabetic polyneuropathy: Secondary | ICD-10-CM

## 2022-02-13 ENCOUNTER — Ambulatory Visit: Payer: 59 | Admitting: Rheumatology

## 2022-04-06 NOTE — Progress Notes (Signed)
Office Visit Note  Patient: [name]             Date of Birth: 1970-10-27           MRN: [id number]             PCP: Everardo Beals, NP Referring: Everardo Beals, NP Visit Date: 04/07/2022 Occupation: Consultant  Subjective:  New Patient (Initial Visit) (Referred because of abnormal lab results. Joint pain everyday, especially elbows, hips (with popping), thumbs and knees. )   History of Present Illness: PATTIANN Avery is a 51 y.o. female here for evaluation of positive ANA associated with numbness and burning of both feet. Symptoms are ongoing for 2 years with some progression involving both feet.  She saw Dr. Leta Baptist for this problem on treatment with Lyrica 150 mg BID and more recently Cymbalta 30 mg and did not benefit with gabapentin.  She discontinued the Cymbalta due to significant constipation side effect when combining this with her Lyrica medication.  She has a history of diabetes to which the problem has been previously attributed.  More recently her diabetes has been better controlled but not associated with any significant improvement in the symptoms.  Laboratory testing in March showed a highly positive ANA antibody but reflex panel was negative and with normal inflammatory markers. Besides the symptoms she is also having increased joint pain symptoms.  She has bilateral knee pain somewhat longstanding but more pronounced during this past year or 2.  Most noticeable when she is climbing stairs but she is able to tolerate 2-3 flights of stairs without having to stop due to weakness or pain.  She does not see visible swelling or feel warmth pain is usually worse around the front or sides of the knees.  She sees swelling around the feet and ankles most commonly worse if she is up on her feet more than usual for the day.  For about the past 6 months she developed nodules on the dorsal side of both thumb IP joints and these have remained persistently since first appearing.  Is  also having some pain in bilateral elbows is increased with use.  She does not take any particular medications for these other joint pains. She has history of intermittent finger discoloration with pallor usually affecting the whole length of a finger at a time most commonly the left or right ring fingers.  This is not particularly associated with cold exposure or stress seems to come randomly.  She sometimes gets numbness down the outside edge of forearms and hands.  She denies any new skin rashes, lymphadenopathy, abnormal bruising or bleeding.  She still has regular menstrual periods that these have been diminished in severity and duration after ablation.  Labs reviewed ANA 1:640 speckled dsDNA, RNP, Sm, SSA, SSB, Scl-70, chromatin, Jo-1, centromere neg ESR 7 CRP <1 ANCA panel unremarkable RPR neg Hgb A1c 7.4%  Activities of Daily Living:  Patient reports morning stiffness for 0 minutes.   Patient Reports nocturnal pain.  Difficulty dressing/grooming: Denies Difficulty climbing stairs: Reports Difficulty getting out of chair: Denies Difficulty using hands for taps, buttons, cutlery, and/or writing: Denies  Review of Systems  Constitutional:  Positive for fatigue.  HENT:  Positive for mouth dryness. Negative for mouth sores.   Eyes:  Negative for dryness.  Respiratory:  Negative for shortness of breath.   Cardiovascular:  Negative for chest pain and palpitations.  Gastrointestinal:  Negative for blood in stool, constipation and diarrhea.  Endocrine: Negative  for increased urination.  Genitourinary:  Negative for involuntary urination.  Musculoskeletal:  Positive for joint pain, joint pain, joint swelling and muscle weakness. Negative for gait problem, myalgias, morning stiffness, muscle tenderness and myalgias.  Skin:  Positive for color change. Negative for rash, hair loss and sensitivity to sunlight.  Allergic/Immunologic: Negative for susceptible to infections.  Neurological:   Negative for dizziness and headaches.  Hematological:  Negative for swollen glands.  Psychiatric/Behavioral:  Positive for sleep disturbance. Negative for depressed mood. The patient is not nervous/anxious.     PMFS History:  Patient Active Problem List   Diagnosis Date Noted   Acute low back pain 04/07/2022   Contusion of lower back 04/07/2022   Maxillary sinusitis 04/07/2022   Bilateral thumb pain 04/07/2022   Positive ANA (antinuclear antibody) 04/07/2022   Other fatigue 04/07/2022   Bilateral knee pain 04/07/2022   Diabetic peripheral neuropathy ([place]) 08/01/2019   Exposure to severe acute respiratory syndrome coronavirus 2 (SARS-CoV-2) 03/10/2019   Diabetes mellitus ([place]) 03/10/2019    Past Medical History:  Diagnosis Date   Anxiety    Asthma    hx of   Diabetes mellitus without complication (HCC)    Diabetic peripheral neuropathy ([place])    Panic attack     Family History  Problem Relation Age of Onset   Healthy Mother    Hypertension Sister    Prostate cancer Maternal Grandfather    Diabetes Daughter    Colon cancer Neg Hx    Esophageal cancer Neg Hx    Rectal cancer Neg Hx    Stomach cancer Neg Hx    Colon polyps Neg Hx    Past Surgical History:  Procedure Laterality Date   CATARACT EXTRACTION, BILATERAL     DILATATION & CURETTAGE/HYSTEROSCOPY WITH MYOSURE N/A 06/21/2015   Procedure: DILATATION & CURETTAGE/HYSTEROSCOPY WITH MYOSURE;  Surgeon: Brien Few, MD;  Location: [facility];  Service: Gynecology;  Laterality: N/A;   KNEE SURGERY Right    NOVASURE ABLATION N/A 06/21/2015   Procedure: NOVASURE ABLATION;  Surgeon: Brien Few, MD;  Location: [facility];  Service: Gynecology;  Laterality: N/A;   TUBAL LIGATION     Social History   Social History Narrative   Right handed    Some Caffeine     There is no immunization history on file for this patient.   Objective: Vital Signs: BP 115/77 (BP Location: Left Arm, Patient Position: Sitting, Cuff Size:  Normal)   Pulse 78   Resp 15   Ht 6' 1"  (1.854 m)   Wt 212 lb 3.2 oz (96.3 kg)   LMP 03/08/2022 (Exact Date)   BMI 28.00 kg/m    Physical Exam HENT:     Right Ear: External ear normal.     Left Ear: External ear normal.     Mouth/Throat:     Mouth: Mucous membranes are moist.     Pharynx: Oropharynx is clear.  Eyes:     Conjunctiva/sclera: Conjunctivae normal.  Cardiovascular:     Rate and Rhythm: Normal rate and regular rhythm.  Pulmonary:     Effort: Pulmonary effort is normal.     Breath sounds: Normal breath sounds.  Musculoskeletal:     Right lower leg: No edema.     Left lower leg: No edema.  Lymphadenopathy:     Cervical: No cervical adenopathy.  Skin:    General: Skin is warm and dry.     Comments: Normal-appearing nailfold capillaroscopy No digital pitting  Neurological:  Mental Status: She is alert.  Psychiatric:        Mood and Affect: Mood normal.      Musculoskeletal Exam:  Shoulders full ROM no tenderness or swelling Elbows full ROM no tenderness or swelling Wrists full ROM no tenderness or swelling Fingers full ROM mild enlargement of IP joint of bilateral thumbs slightly compressible swelling on the dorsal side Knees full ROM no palpable effusion, mild tenderness to pressure on medial and lateral side anteriorly Hoffa's fat pad Ankles full ROM no tenderness or swelling Negative MTP squeeze tenderness   Investigation: No additional findings.  Imaging: No results found.  Recent Labs: Lab Results  Component Value Date   WBC 5.1 08/16/2021   HGB 12.1 08/16/2021   PLT 289 08/16/2021   NA 140 08/16/2021   K 4.1 08/16/2021   CL 102 08/16/2021   CO2 25 08/16/2021   GLUCOSE 221 (H) 08/16/2021   BUN 11 08/16/2021   CREATININE 1.13 (H) 08/16/2021   BILITOT 1.0 08/16/2021   ALKPHOS 78 08/16/2021   AST 15 08/16/2021   ALT 9 08/16/2021   PROT 7.2 08/16/2021   ALBUMIN 4.4 08/16/2021   CALCIUM 9.3 08/16/2021   GFRAA >60 03/09/2019     Speciality Comments: No specialty comments available.  Procedures:  No procedures performed Allergies: Penicillins   Assessment / Plan:     Visit Diagnoses: Positive ANA (antinuclear antibody) - Plan: Sjogrens syndrome-A extractable nuclear antibody, Sjogrens syndrome-B extractable nuclear antibody, Anti-DNA antibody, double-stranded, C3 and C4, Rheumatoid factor, Cyclic citrul peptide antibody, IgG, Sedimentation rate, C-reactive protein  Positive ANA without specific clinical criteria on exam outside of arthralgias.  Will plan to check more specific antibody panel as detailed above also checking serum complements inflammatory markers also rheumatoid factor and CCP for any additional evidence of underlying cause for arthritis symptoms.  If all negative may be more associated with primary osteoarthritis.  Bilateral thumb pain - Plan: XR Hand 2 View Right, XR Hand 2 View Left  X-ray of bilateral hands appears consistent with primary osteoarthritis no evidence of chronic inflammatory or erosive joint damage.  Other fatigue - Plan: TSH  Will recheck a TSH given the chronic fatigue and relatively frequent association with positive ANA though her labs have been normal when checked in the past.  Orders: Orders Placed This Encounter  Procedures   XR Hand 2 View Right   XR Hand 2 View Left   Sjogrens syndrome-A extractable nuclear antibody   Sjogrens syndrome-B extractable nuclear antibody   Anti-DNA antibody, double-stranded   C3 and C4   Rheumatoid factor   Cyclic citrul peptide antibody, IgG   Sedimentation rate   C-reactive protein   TSH   No orders of the defined types were placed in this encounter.    Follow-Up Instructions: Return in about 3 weeks (around 04/28/2022) for New pt +ANA (?) f/u 3wks.   Collier Salina, MD  Note - This record has been created using [employer].  Chart creation errors have been sought, but may not always  have been located. Such  creation errors do not reflect on  the standard of medical care.

## 2022-04-07 ENCOUNTER — Encounter: Payer: Self-pay | Admitting: Internal Medicine

## 2022-04-07 ENCOUNTER — Ambulatory Visit (INDEPENDENT_AMBULATORY_CARE_PROVIDER_SITE_OTHER): Payer: Commercial Managed Care - HMO

## 2022-04-07 ENCOUNTER — Ambulatory Visit: Payer: Commercial Managed Care - HMO

## 2022-04-07 ENCOUNTER — Ambulatory Visit: Payer: Commercial Managed Care - HMO | Attending: Internal Medicine | Admitting: Internal Medicine

## 2022-04-07 VITALS — BP 115/77 | HR 78 | Resp 15 | Ht 73.0 in | Wt 212.2 lb

## 2022-04-07 DIAGNOSIS — M79642 Pain in left hand: Secondary | ICD-10-CM

## 2022-04-07 DIAGNOSIS — M79644 Pain in right finger(s): Secondary | ICD-10-CM

## 2022-04-07 DIAGNOSIS — M79641 Pain in right hand: Secondary | ICD-10-CM

## 2022-04-07 DIAGNOSIS — M79645 Pain in left finger(s): Secondary | ICD-10-CM | POA: Diagnosis not present

## 2022-04-07 DIAGNOSIS — M25562 Pain in left knee: Secondary | ICD-10-CM

## 2022-04-07 DIAGNOSIS — M25561 Pain in right knee: Secondary | ICD-10-CM

## 2022-04-07 DIAGNOSIS — J320 Chronic maxillary sinusitis: Secondary | ICD-10-CM | POA: Insufficient documentation

## 2022-04-07 DIAGNOSIS — R5383 Other fatigue: Secondary | ICD-10-CM | POA: Diagnosis not present

## 2022-04-07 DIAGNOSIS — S300XXA Contusion of lower back and pelvis, initial encounter: Secondary | ICD-10-CM | POA: Insufficient documentation

## 2022-04-07 DIAGNOSIS — R768 Other specified abnormal immunological findings in serum: Secondary | ICD-10-CM | POA: Diagnosis not present

## 2022-04-07 DIAGNOSIS — G8929 Other chronic pain: Secondary | ICD-10-CM

## 2022-04-07 DIAGNOSIS — M545 Low back pain, unspecified: Secondary | ICD-10-CM | POA: Insufficient documentation

## 2022-04-09 LAB — RHEUMATOID FACTOR: Rheumatoid fact SerPl-aCnc: <14 IU/mL (ref <14)

## 2022-04-09 LAB — SJOGRENS SYNDROME-B EXTRACTABLE NUCLEAR ANTIBODY: SSB (La) (ENA) Antibody, IgG: <1 AI

## 2022-04-09 LAB — TSH: TSH: 0.9 mIU/L

## 2022-04-09 LAB — SEDIMENTATION RATE: Sed Rate: 19 mm/h (ref 0–30)

## 2022-04-09 LAB — CYCLIC CITRUL PEPTIDE ANTIBODY, IGG: Cyclic Citrullin Peptide Ab: <16 UNITS

## 2022-04-09 LAB — C3 AND C4
C3 Complement: 74 mg/dL — ABNORMAL LOW (ref 83–193)
C4 Complement: 38 mg/dL (ref 15–57)

## 2022-04-09 LAB — SJOGRENS SYNDROME-A EXTRACTABLE NUCLEAR ANTIBODY: SSA (Ro) (ENA) Antibody, IgG: <1 AI

## 2022-04-09 LAB — C-REACTIVE PROTEIN: CRP: 2.8 mg/L (ref <8.0)

## 2022-04-09 LAB — ANTI-DNA ANTIBODY, DOUBLE-STRANDED: ds DNA Ab: <1 IU/mL

## 2022-04-11 NOTE — Progress Notes (Signed)
Lab results are negative for evidence of inflammation. Also negative antibody markers for rheumatoid arthritis or lupus. Her thyroid function test is in the normal range.  Xrays of her hands show large bone spurs at the nodule areas on her thumbs. This is due to osteoarthritis, with wear and tear or overuse of the thumb joints. There is no specific medication for this but antiinflammatory drugs such as ibuprofen and aleve are often beneficial for symptoms. If pain with these nodules are causing a lot of trouble going forwards she could also see an orthopedic hand specialist about it.  I don't think we need to keep a scheduled follow up appointment at this time.

## 2022-05-23 ENCOUNTER — Ambulatory Visit: Payer: Commercial Managed Care - HMO | Admitting: Internal Medicine

## 2023-05-17 DIAGNOSIS — N898 Other specified noninflammatory disorders of vagina: Secondary | ICD-10-CM | POA: Diagnosis not present

## 2023-05-17 DIAGNOSIS — N760 Acute vaginitis: Secondary | ICD-10-CM | POA: Diagnosis not present

## 2023-05-17 DIAGNOSIS — E1165 Type 2 diabetes mellitus with hyperglycemia: Secondary | ICD-10-CM | POA: Diagnosis not present

## 2023-08-13 DIAGNOSIS — M62830 Muscle spasm of back: Secondary | ICD-10-CM | POA: Diagnosis not present

## 2023-08-13 DIAGNOSIS — E1165 Type 2 diabetes mellitus with hyperglycemia: Secondary | ICD-10-CM | POA: Diagnosis not present

## 2023-08-13 DIAGNOSIS — N760 Acute vaginitis: Secondary | ICD-10-CM | POA: Diagnosis not present

## 2023-12-13 DIAGNOSIS — M25512 Pain in left shoulder: Secondary | ICD-10-CM | POA: Diagnosis not present

## 2023-12-15 DIAGNOSIS — M25512 Pain in left shoulder: Secondary | ICD-10-CM | POA: Diagnosis not present

## 2024-01-11 DIAGNOSIS — M25512 Pain in left shoulder: Secondary | ICD-10-CM | POA: Diagnosis not present

## 2024-05-01 DIAGNOSIS — E1165 Type 2 diabetes mellitus with hyperglycemia: Secondary | ICD-10-CM | POA: Diagnosis not present

## 2024-05-01 DIAGNOSIS — M5489 Other dorsalgia: Secondary | ICD-10-CM | POA: Diagnosis not present

## 2024-05-09 DIAGNOSIS — E1165 Type 2 diabetes mellitus with hyperglycemia: Secondary | ICD-10-CM | POA: Diagnosis not present
